# Patient Record
Sex: Female | Born: 1935 | Race: Black or African American | Hispanic: No | Smoking: Never smoker
Health system: Southern US, Community
[De-identification: ages and names within clinical notes are randomized; demographics above are authoritative.]

## PROBLEM LIST (undated history)

## (undated) DIAGNOSIS — K219 Gastro-esophageal reflux disease without esophagitis: Secondary | ICD-10-CM

## (undated) DIAGNOSIS — J309 Allergic rhinitis, unspecified: Secondary | ICD-10-CM

## (undated) DIAGNOSIS — K5909 Other constipation: Secondary | ICD-10-CM

## (undated) DIAGNOSIS — R1013 Epigastric pain: Secondary | ICD-10-CM

## (undated) DIAGNOSIS — I10 Essential (primary) hypertension: Secondary | ICD-10-CM

## (undated) DIAGNOSIS — K3189 Other diseases of stomach and duodenum: Secondary | ICD-10-CM

## (undated) DIAGNOSIS — E1139 Type 2 diabetes mellitus with other diabetic ophthalmic complication: Secondary | ICD-10-CM

## (undated) DIAGNOSIS — E119 Type 2 diabetes mellitus without complications: Secondary | ICD-10-CM

## (undated) DIAGNOSIS — K861 Other chronic pancreatitis: Secondary | ICD-10-CM

## (undated) DIAGNOSIS — K649 Unspecified hemorrhoids: Secondary | ICD-10-CM

## (undated) HISTORY — DX: Unspecified hemorrhoids: K64.9

## (undated) HISTORY — PX: ABDOMINAL HYSTERECTOMY: SHX81

## (undated) HISTORY — DX: Epigastric pain: R10.13

## (undated) HISTORY — DX: Gastro-esophageal reflux disease without esophagitis: K21.9

## (undated) HISTORY — DX: Other chronic pancreatitis: K86.1

## (undated) HISTORY — DX: Other constipation: K59.09

## (undated) HISTORY — DX: Type 2 diabetes mellitus without complications: E11.9

## (undated) HISTORY — DX: Other diseases of stomach and duodenum: K31.89

## (undated) HISTORY — DX: Allergic rhinitis, unspecified: J30.9

## (undated) HISTORY — DX: Essential (primary) hypertension: I10

## (undated) HISTORY — DX: Type 2 diabetes mellitus with other diabetic ophthalmic complication: E11.39

---

## 1997-10-25 ENCOUNTER — Encounter: Payer: Self-pay | Admitting: Gastroenterology

## 1998-04-25 ENCOUNTER — Other Ambulatory Visit: Admission: RE | Admit: 1998-04-25 | Discharge: 1998-04-25 | Payer: Self-pay | Admitting: *Deleted

## 1999-10-23 ENCOUNTER — Encounter: Admission: RE | Admit: 1999-10-23 | Discharge: 1999-10-23 | Payer: Self-pay | Admitting: *Deleted

## 1999-10-23 ENCOUNTER — Encounter: Payer: Self-pay | Admitting: *Deleted

## 2000-06-13 ENCOUNTER — Other Ambulatory Visit: Admission: RE | Admit: 2000-06-13 | Discharge: 2000-06-13 | Payer: Self-pay | Admitting: *Deleted

## 2001-02-17 ENCOUNTER — Encounter: Admission: RE | Admit: 2001-02-17 | Discharge: 2001-02-17 | Payer: Self-pay | Admitting: *Deleted

## 2001-02-17 ENCOUNTER — Encounter: Payer: Self-pay | Admitting: *Deleted

## 2001-02-19 ENCOUNTER — Encounter: Admission: RE | Admit: 2001-02-19 | Discharge: 2001-02-19 | Payer: Self-pay | Admitting: *Deleted

## 2001-02-19 ENCOUNTER — Encounter: Payer: Self-pay | Admitting: *Deleted

## 2002-03-02 ENCOUNTER — Encounter: Admission: RE | Admit: 2002-03-02 | Discharge: 2002-03-02 | Payer: Self-pay | Admitting: *Deleted

## 2002-03-02 ENCOUNTER — Encounter: Payer: Self-pay | Admitting: *Deleted

## 2002-03-13 ENCOUNTER — Encounter: Payer: Self-pay | Admitting: Emergency Medicine

## 2002-03-13 ENCOUNTER — Emergency Department (HOSPITAL_COMMUNITY): Admission: EM | Admit: 2002-03-13 | Discharge: 2002-03-14 | Payer: Self-pay | Admitting: Emergency Medicine

## 2002-05-11 ENCOUNTER — Other Ambulatory Visit: Admission: RE | Admit: 2002-05-11 | Discharge: 2002-05-11 | Payer: Self-pay | Admitting: *Deleted

## 2003-03-24 ENCOUNTER — Encounter: Payer: Self-pay | Admitting: *Deleted

## 2003-03-24 ENCOUNTER — Encounter: Admission: RE | Admit: 2003-03-24 | Discharge: 2003-03-24 | Payer: Self-pay | Admitting: *Deleted

## 2003-09-09 ENCOUNTER — Encounter: Admission: RE | Admit: 2003-09-09 | Discharge: 2003-09-09 | Payer: Self-pay | Admitting: *Deleted

## 2004-07-17 ENCOUNTER — Other Ambulatory Visit: Admission: RE | Admit: 2004-07-17 | Discharge: 2004-07-17 | Payer: Self-pay | Admitting: *Deleted

## 2004-10-30 ENCOUNTER — Encounter: Admission: RE | Admit: 2004-10-30 | Discharge: 2004-10-30 | Payer: Self-pay | Admitting: Endocrinology

## 2006-01-14 ENCOUNTER — Encounter: Admission: RE | Admit: 2006-01-14 | Discharge: 2006-01-14 | Payer: Self-pay | Admitting: Endocrinology

## 2007-01-24 ENCOUNTER — Encounter: Admission: RE | Admit: 2007-01-24 | Discharge: 2007-01-24 | Payer: Self-pay | Admitting: *Deleted

## 2008-02-16 ENCOUNTER — Encounter: Admission: RE | Admit: 2008-02-16 | Discharge: 2008-02-16 | Payer: Self-pay | Admitting: Endocrinology

## 2008-04-05 ENCOUNTER — Other Ambulatory Visit: Admission: RE | Admit: 2008-04-05 | Discharge: 2008-04-05 | Payer: Self-pay | Admitting: Gynecology

## 2008-04-27 ENCOUNTER — Encounter: Payer: Self-pay | Admitting: Gastroenterology

## 2008-04-28 DIAGNOSIS — K3189 Other diseases of stomach and duodenum: Secondary | ICD-10-CM | POA: Insufficient documentation

## 2008-04-28 DIAGNOSIS — R1013 Epigastric pain: Secondary | ICD-10-CM

## 2008-04-28 DIAGNOSIS — K649 Unspecified hemorrhoids: Secondary | ICD-10-CM | POA: Insufficient documentation

## 2008-04-28 DIAGNOSIS — E119 Type 2 diabetes mellitus without complications: Secondary | ICD-10-CM | POA: Insufficient documentation

## 2008-04-28 DIAGNOSIS — J309 Allergic rhinitis, unspecified: Secondary | ICD-10-CM | POA: Insufficient documentation

## 2008-04-28 DIAGNOSIS — E0840 Diabetes mellitus due to underlying condition with diabetic neuropathy, unspecified: Secondary | ICD-10-CM | POA: Insufficient documentation

## 2008-04-28 DIAGNOSIS — I10 Essential (primary) hypertension: Secondary | ICD-10-CM

## 2008-04-28 DIAGNOSIS — K5909 Other constipation: Secondary | ICD-10-CM | POA: Insufficient documentation

## 2008-04-28 DIAGNOSIS — I152 Hypertension secondary to endocrine disorders: Secondary | ICD-10-CM | POA: Insufficient documentation

## 2008-04-28 DIAGNOSIS — E1139 Type 2 diabetes mellitus with other diabetic ophthalmic complication: Secondary | ICD-10-CM | POA: Insufficient documentation

## 2008-04-29 ENCOUNTER — Ambulatory Visit: Payer: Self-pay | Admitting: Gastroenterology

## 2008-04-29 DIAGNOSIS — K5900 Constipation, unspecified: Secondary | ICD-10-CM | POA: Insufficient documentation

## 2008-04-29 DIAGNOSIS — K861 Other chronic pancreatitis: Secondary | ICD-10-CM | POA: Insufficient documentation

## 2008-04-29 LAB — CONVERTED CEMR LAB
ALT: 16 units/L (ref 0–35)
AST: 18 units/L (ref 0–37)
Albumin: 4 g/dL (ref 3.5–5.2)
BUN: 10 mg/dL (ref 6–23)
Basophils Relative: 1 % (ref 0.0–3.0)
CO2: 32 meq/L (ref 19–32)
Chloride: 104 meq/L (ref 96–112)
Creatinine, Ser: 0.8 mg/dL (ref 0.4–1.2)
Eosinophils Absolute: 0 10*3/uL (ref 0.0–0.7)
Eosinophils Relative: 1 % (ref 0.0–5.0)
Ferritin: 34.8 ng/mL (ref 10.0–291.0)
Folate: 15.3 ng/mL
GFR calc non Af Amer: 75 mL/min
Glucose, Bld: 158 mg/dL — ABNORMAL HIGH (ref 70–99)
Iron: 67 ug/dL (ref 42–145)
MCV: 83.7 fL (ref 78.0–100.0)
Monocytes Relative: 6.6 % (ref 3.0–12.0)
Neutrophils Relative %: 51.9 % (ref 43.0–77.0)
RBC: 4.76 M/uL (ref 3.87–5.11)
TSH: 2.92 microintl units/mL (ref 0.35–5.50)
Total Protein: 7.3 g/dL (ref 6.0–8.3)
Vitamin B-12: 479 pg/mL (ref 211–911)
WBC: 4.3 10*3/uL — ABNORMAL LOW (ref 4.5–10.5)

## 2008-05-19 ENCOUNTER — Encounter: Payer: Self-pay | Admitting: Gastroenterology

## 2008-05-19 ENCOUNTER — Ambulatory Visit: Payer: Self-pay | Admitting: Gastroenterology

## 2008-05-24 ENCOUNTER — Encounter: Payer: Self-pay | Admitting: Gastroenterology

## 2008-06-15 ENCOUNTER — Ambulatory Visit: Payer: Self-pay | Admitting: Gastroenterology

## 2008-11-29 ENCOUNTER — Telehealth: Payer: Self-pay | Admitting: Gastroenterology

## 2009-11-09 ENCOUNTER — Telehealth: Payer: Self-pay | Admitting: Gastroenterology

## 2010-04-28 ENCOUNTER — Encounter: Admission: RE | Admit: 2010-04-28 | Discharge: 2010-04-28 | Payer: Self-pay | Admitting: Endocrinology

## 2010-07-16 ENCOUNTER — Encounter: Payer: Self-pay | Admitting: Endocrinology

## 2010-07-25 NOTE — Progress Notes (Signed)
Summary: Triage  Phone Note Call from Patient Call back at Home Phone [phone]   Caller: Patient Call For: Dr. Jarold Motto Reason for Call: Talk to Nurse Summary of Call: What can she take other than Prevacid for GERD...prevacid is not working Initial call taken by: Karna Christmas,  Nov 09, 2009 9:18 AM  Follow-up for Phone Call        Pt has been taking OTC Prevacid 15 mg daily.  Having spells of reflux.  Asks if she needs a stronger medication.   Follow-up by: Ashok Cordia RN,  Nov 09, 2009 9:38 AM  Additional Follow-up for Phone Call Additional follow up Details #1::        Appt scheduled.  Pt wants to wait until after first of June for appt.  Additional Follow-up by: Ashok Cordia RN,  Nov 09, 2009 11:30 AM

## 2010-09-15 ENCOUNTER — Encounter: Payer: Self-pay | Admitting: Gastroenterology

## 2010-09-15 ENCOUNTER — Ambulatory Visit (INDEPENDENT_AMBULATORY_CARE_PROVIDER_SITE_OTHER): Payer: MEDICARE | Admitting: Gastroenterology

## 2010-09-15 VITALS — BP 126/80 | HR 84 | Ht 59.0 in | Wt 161.0 lb

## 2010-09-15 DIAGNOSIS — K5900 Constipation, unspecified: Secondary | ICD-10-CM

## 2010-09-15 DIAGNOSIS — K219 Gastro-esophageal reflux disease without esophagitis: Secondary | ICD-10-CM

## 2010-09-15 DIAGNOSIS — E119 Type 2 diabetes mellitus without complications: Secondary | ICD-10-CM

## 2010-09-15 MED ORDER — POLYETHYLENE GLYCOL 3350 17 GM/SCOOP PO POWD: 17.0000 g | Freq: Every day | ORAL | Status: DC

## 2010-09-15 MED ORDER — DEXLANSOPRAZOLE 60 MG PO CPDR: 60.0000 mg | DELAYED_RELEASE_CAPSULE | Freq: Every day | ORAL | Status: DC

## 2010-09-15 MED ORDER — CELECOXIB 200 MG PO CAPS: 200.0000 mg | ORAL_CAPSULE | Freq: Every day | ORAL | Status: DC

## 2010-09-15 NOTE — Progress Notes (Signed)
History of Present Illness: This is a  Elderly African American female with type 2 diabetes followed by Dr. Vivien Rossetti  Endocrinology on insulin therapy. He does have complications from her diabetes and suspected the motility disorder with chronic constipation and probable mild gastroparesis. Past she's been treated for H. Pylori infection , but I'm not sure this ever helped her dyspepsia symptomatology. He currently has worsening acid reflux and regurgitation with burning substernal chest pain postprandial regurgitation of food products. She denies dysphagia, anorexia or weight loss. Her chronic constipation is non-responsive to Metamucil. She denies rectal bleeding melena. She does have severe pain in her right hip which limits her mobility. He denies use of other NSAIDs, alcohol or cigarettes. She does have a family history of colon cancer in several of her aunts and is up to date on her colonoscopy and endoscopies which were done in 2009.    Current Medications, Allergies, Past Medical History, Past Surgical History, Family History and Social History were reviewed in [employer] record.  Past Medical History  Diagnosis Date  . Unspecified hemorrhoids without mention of complication   . Other constipation   . Dyspepsia and other specified disorders of function of stomach   . Allergic rhinitis, cause unspecified   . Unspecified essential hypertension   . Type II or unspecified type diabetes mellitus without mention of complication, not stated as uncontrolled   . Type II or unspecified type diabetes mellitus with ophthalmic manifestations, not stated as uncontrolled   . GERD (gastroesophageal reflux disease)    Past Surgical History  Procedure Date  . Abdominal hysterectomy   . Cesarean section     X two    reports that she has never smoked. She does not have any smokeless tobacco history on file. She reports that she does not drink alcohol or use illicit drugs. family  history includes Colon cancer in her sister and Heart disease in her father. No Known Allergies    Physical Exam: General: Well developed , well nourished, no acute distress Head: Normocephalic and atraumatic Eyes:  sclerae anicteric, EOMI Ears: Normal auditory acuity Mouth: No deformity or lesions Lungs: Clear throughout to auscultation Heart: Regular rate and rhythm; no murmurs, rubs or bruits Abdomen: Soft, non tender and non distended. No masses, hepatosplenomegaly or hernias noted. Normal Bowel sounds  Musculoskeletal: Symmetrical with no gross deformities  Pulses:  Normal pulses noted Extremities: No clubbing, cyanosis, edema or deformities noted Neurological: Alert oriented x 4, grossly nonfocal Psychological:  Alert and cooperative. 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 She has previously been treated for H. Pylori infection. I have reviewed antireflux regime with her and have given her samples of   Derxilant 60 mg a day  to take 30 minutes before her first meal of the day.. For her hip arthritis,I prescribed Celebrex 200 mg a day as tolerated. Also we will stop Metamucil and place her on her Miralax 8 ounces at bedtime as tolerated for her constipation. We will requestslab data for review from her endocrinologist. I will see her back in 3 weeks' time for followup.

## 2010-09-15 NOTE — Patient Instructions (Addendum)
Stop Prevacid and Metamucil. Start Miralax  Every night.  Try the samples of Dexilant, until your next office visit.  You signed the Records Release for our office to get your records from Dr. Leslie Dales.

## 2010-09-15 NOTE — Assessment & Plan Note (Signed)
Doing well on insulin per endocrinology.

## 2010-09-19 ENCOUNTER — Telehealth: Payer: Self-pay | Admitting: Gastroenterology

## 2010-09-19 NOTE — Telephone Encounter (Signed)
Her insurance will not cover Celebrex, is there an alt drug?

## 2010-09-20 NOTE — Telephone Encounter (Signed)
no

## 2010-09-20 NOTE — Telephone Encounter (Signed)
Advised pharm by fax and phone call no alt for Celebrex. They will advised pt she can purchase.

## 2010-10-05 ENCOUNTER — Telehealth: Payer: Self-pay | Admitting: Gastroenterology

## 2010-10-05 NOTE — Telephone Encounter (Signed)
Forwarded to Dr. Patterson for review.  °

## 2010-10-10 ENCOUNTER — Encounter: Payer: Self-pay | Admitting: Gastroenterology

## 2010-10-10 ENCOUNTER — Ambulatory Visit (INDEPENDENT_AMBULATORY_CARE_PROVIDER_SITE_OTHER): Payer: MEDICARE | Admitting: Gastroenterology

## 2010-10-10 DIAGNOSIS — K219 Gastro-esophageal reflux disease without esophagitis: Secondary | ICD-10-CM | POA: Insufficient documentation

## 2010-10-10 DIAGNOSIS — K589 Irritable bowel syndrome without diarrhea: Secondary | ICD-10-CM

## 2010-10-10 MED ORDER — DEXLANSOPRAZOLE 60 MG PO CPDR: 60.0000 mg | DELAYED_RELEASE_CAPSULE | Freq: Every day | ORAL | Status: DC

## 2010-10-10 NOTE — Patient Instructions (Signed)
Take the Dexilant once a day, we have gave you samples when the samples run out you will need to call back and we will send an rx.

## 2010-10-10 NOTE — Progress Notes (Signed)
History of Present Illness: This is a elderly 77 year old African American female who has chronic GERD well-managed currently on Dexilant 60 mg a day. She also has suspected chronic pancreatitis is not on pancreatic extracts. Her diabetes is well controlled on insulin therapy. Her chronic constipation seems to be managed with fiber supplements and liberal by mouth fluids. Currently there is no abdominal pain, melena or hematochezia, anorexia or weight loss.    Current Medications, Allergies, Past Medical History, Past Surgical History, Family History and Social History were reviewed in [employer] record.   Assessment and plan: Continue current medications as tolerated with when necessary followup as needed. No diagnosis found.

## 2011-03-28 LAB — GLUCOSE, CAPILLARY: Glucose-Capillary: 168 — ABNORMAL HIGH

## 2011-05-22 ENCOUNTER — Emergency Department (HOSPITAL_COMMUNITY)
Admission: EM | Admit: 2011-05-22 | Discharge: 2011-05-22 | Disposition: A | Discharge status: Home / Self Care | Payer: MEDICARE | Attending: Emergency Medicine | Admitting: Emergency Medicine

## 2011-05-22 ENCOUNTER — Encounter (HOSPITAL_COMMUNITY): Payer: Self-pay

## 2011-05-22 ENCOUNTER — Other Ambulatory Visit: Payer: Self-pay

## 2011-05-22 DIAGNOSIS — I10 Essential (primary) hypertension: Secondary | ICD-10-CM | POA: Insufficient documentation

## 2011-05-22 DIAGNOSIS — Z794 Long term (current) use of insulin: Secondary | ICD-10-CM | POA: Insufficient documentation

## 2011-05-22 DIAGNOSIS — K219 Gastro-esophageal reflux disease without esophagitis: Secondary | ICD-10-CM | POA: Insufficient documentation

## 2011-05-22 DIAGNOSIS — R002 Palpitations: Secondary | ICD-10-CM | POA: Insufficient documentation

## 2011-05-22 DIAGNOSIS — Z79899 Other long term (current) drug therapy: Secondary | ICD-10-CM | POA: Insufficient documentation

## 2011-05-22 DIAGNOSIS — R000 Tachycardia, unspecified: Secondary | ICD-10-CM | POA: Insufficient documentation

## 2011-05-22 DIAGNOSIS — E119 Type 2 diabetes mellitus without complications: Secondary | ICD-10-CM | POA: Insufficient documentation

## 2011-05-22 LAB — COMPREHENSIVE METABOLIC PANEL
AST: 22 U/L (ref 0–37)
Alkaline Phosphatase: 70 U/L (ref 39–117)
CO2: 28 mEq/L (ref 19–32)
Chloride: 101 mEq/L (ref 96–112)
Creatinine, Ser: 0.65 mg/dL (ref 0.50–1.10)
GFR calc non Af Amer: 84 mL/min — ABNORMAL LOW (ref >90)
Potassium: 3.3 mEq/L — ABNORMAL LOW (ref 3.5–5.1)
Total Bilirubin: 0.3 mg/dL (ref 0.3–1.2)

## 2011-05-22 LAB — DIFFERENTIAL
Basophils Absolute: 0 10*3/uL (ref 0.0–0.1)
Lymphocytes Relative: 16 % (ref 12–46)
Monocytes Absolute: 0.5 10*3/uL (ref 0.1–1.0)
Monocytes Relative: 6 % (ref 3–12)
Neutro Abs: 7 10*3/uL (ref 1.7–7.7)
Neutrophils Relative %: 78 % — ABNORMAL HIGH (ref 43–77)

## 2011-05-22 LAB — CBC
HCT: 42.2 % (ref 36.0–46.0)
Hemoglobin: 13.4 g/dL (ref 12.0–15.0)
RDW: 14.2 % (ref 11.5–15.5)
WBC: 8.9 10*3/uL (ref 4.0–10.5)

## 2011-05-22 LAB — CK TOTAL AND CKMB (NOT AT ARMC): CK, MB: 3.8 ng/mL (ref 0.3–4.0)

## 2011-05-22 MED ORDER — GLUCOSE 40 % PO GEL
ORAL | Status: AC
  Filled 2011-05-22: qty 1

## 2011-05-22 NOTE — ED Notes (Signed)
Patient stated to EMS she felt like her heart was "racing away"  Ate supper about 5pm and then shortley thereafter felt like her heart was racing.  Has had some stress recently.

## 2011-05-22 NOTE — ED Notes (Signed)
Pt given glucose due to low blood sugar - apparently checked sugar at home - took insulin, then ate, then blood glucose fell.  Feels fine now.

## 2011-05-22 NOTE — ED Notes (Signed)
Pt ambulatory to bathroom without difficulty - requested specimen.

## 2011-05-22 NOTE — ED Provider Notes (Signed)
History     CSN: [id number] Arrival date & time: 05/22/2011  7:28 PM   First MD Initiated Contact with Patient 05/22/11 1953      Chief Complaint  Patient presents with  . Palpitations    (Consider location/radiation/quality/duration/timing/severity/associated sxs/prior treatment) HPI Comments: Was at home, had just eaten, then started with her heart racing, palpitations.  She denied chest pain.  No sob or cough.  Resolved prior to ambulance arrival.  Feels fine now.    Patient is a 77 y.o. female presenting with palpitations. The history is provided by the patient.  Palpitations  This is a new problem. The current episode started less than 1 hour ago. The problem occurs constantly. The problem has been resolved. The problem is associated with stress. Pertinent negatives include no diaphoresis, no fever, no chest pain, no abdominal pain, no nausea and no shortness of breath.    Past Medical History  Diagnosis Date  . Unspecified hemorrhoids without mention of complication   . Other constipation   . Dyspepsia and other specified disorders of function of stomach   . Allergic rhinitis, cause unspecified   . Unspecified essential hypertension   . Type II or unspecified type diabetes mellitus without mention of complication, not stated as uncontrolled   . Type II or unspecified type diabetes mellitus with ophthalmic manifestations, not stated as uncontrolled   . GERD (gastroesophageal reflux disease)   . Chronic pancreatitis   . Family history of malignant neoplasm of gastrointestinal tract     Past Surgical History  Procedure Date  . Abdominal hysterectomy   . Cesarean section     X two    Family History  Problem Relation Age of Onset  . Heart disease Father   . Colon cancer Sister     and Several Aunts    History  Substance Use Topics  . Smoking status: Never Smoker   . Smokeless tobacco: Not on file  . Alcohol Use: No    OB History    Grav Para Term Preterm  Abortions TAB SAB Ect Mult Living                  Review of Systems  Constitutional: Negative for fever and diaphoresis.  Respiratory: Negative for shortness of breath.   Cardiovascular: Positive for palpitations. Negative for chest pain.  Gastrointestinal: Negative for nausea and abdominal pain.    Allergies  Review of patient's allergies indicates no known allergies.  Home Medications   Current Outpatient Rx  Name Route Sig Dispense Refill  . AMLODIPINE BESYLATE 10 MG PO TABS Oral Take 10 mg by mouth daily.      [name] 66.10-12-73 MU PO CPEP Oral Take 1 capsule by mouth daily.      Olivia Roberts CALCIUM CARBONATE ANTACID 500 MG PO CHEW Oral Chew 2 tablets by mouth daily. At dinnertime     . DEXLANSOPRAZOLE 60 MG PO CPDR Oral Take 1 capsule (60 mg total) by mouth daily. 60 capsule 0    Samples of this drug were given to the patient, qu ...  . ERGOCALCIFEROL 50000 UNITS PO CAPS Oral Take 50,000 Units by mouth once a week. Take one by mouth once a week Every Sunday    . FLUVASTATIN SODIUM ER 80 MG PO TB24 Oral Take 80 mg by mouth daily.      . INSULIN LISPRO PROT & LISPRO (75-25) 100 UNIT/ML [place] SUSP Subcutaneous Inject 10-15 Units into the skin 2 (two) times daily with a  meal. 10 units in the morning 15 units in the evening    . LOSARTAN POTASSIUM-HCTZ 100-12.5 MG PO TABS Oral Take 1 tablet by mouth daily.     Olivia Roberts OLMESARTAN MEDOXOMIL-HCTZ 40-12.5 MG PO TABS Oral Take 1 tablet by mouth daily.      Olivia Roberts PIOGLITAZONE HCL 30 MG PO TABS Oral Take 30 mg by mouth daily.      Olivia Roberts POTASSIUM CHLORIDE CRYS CR 20 MEQ PO TBCR Oral Take 20 mEq by mouth daily.      Olivia Roberts POTASSIUM CHLORIDE CRYS CR 20 MEQ PO TBCR Oral Take 10 mEq by mouth once.      Olivia Roberts PRAVASTATIN SODIUM 80 MG PO TABS Oral Take 80 mg by mouth daily.     Olivia Roberts METAMUCIL PO Oral Take 10 mLs by mouth daily. Two tablespoons twice a day      BP 135/63  Pulse 88  Temp(Src) 97.6 F (36.4 C) (Oral)  Resp 18  SpO2 97%  Physical Exam  ED  Course  Procedures (including critical care time)   Labs Reviewed  CBC  DIFFERENTIAL  COMPREHENSIVE METABOLIC PANEL  CK TOTAL AND CKMB  TROPONIN I  TSH   No results found.   No diagnosis found.   Date: 05/22/2011  Rate: 84  Rhythm: normal sinus rhythm  QRS Axis: normal  Intervals: normal  ST/T Wave abnormalities: normal  Conduction Disutrbances:none  Narrative Interpretation:   Old EKG Reviewed: unchanged    MDM  Patient arrived symptom-free and remained that way throughout the visit.  Her EKG showed a sinus rhythm without any acute findings.  Her labs were okay with the exception of a slightly low K of 3.3.  I will discharge the patient and have her follow up with her pcp and return if she worsens.  She has been under a lot of stress and tonight's episode could have been related to anxiety.        Geoffery Lyons, MD 05/22/11 2221

## 2011-05-22 NOTE — ED Notes (Signed)
Pt states she had just finished eating when she became sweaty and had some heart palpitations - no history of cardiac problems - feels fine now - denies chest pain or any pain or discomfort at this time.

## 2011-05-23 LAB — TSH: TSH: 4.08 u[IU]/mL (ref 0.350–4.500)

## 2011-07-26 ENCOUNTER — Other Ambulatory Visit: Payer: Self-pay | Admitting: Gastroenterology

## 2011-08-25 ENCOUNTER — Other Ambulatory Visit: Payer: Self-pay | Admitting: Gastroenterology

## 2011-09-12 DIAGNOSIS — E785 Hyperlipidemia, unspecified: Secondary | ICD-10-CM | POA: Diagnosis not present

## 2011-09-12 DIAGNOSIS — R82998 Other abnormal findings in urine: Secondary | ICD-10-CM | POA: Diagnosis not present

## 2011-09-12 DIAGNOSIS — E559 Vitamin D deficiency, unspecified: Secondary | ICD-10-CM | POA: Diagnosis not present

## 2011-09-12 DIAGNOSIS — E119 Type 2 diabetes mellitus without complications: Secondary | ICD-10-CM | POA: Diagnosis not present

## 2011-09-14 DIAGNOSIS — F341 Dysthymic disorder: Secondary | ICD-10-CM | POA: Diagnosis not present

## 2011-09-14 DIAGNOSIS — I10 Essential (primary) hypertension: Secondary | ICD-10-CM | POA: Diagnosis not present

## 2011-09-14 DIAGNOSIS — E559 Vitamin D deficiency, unspecified: Secondary | ICD-10-CM | POA: Diagnosis not present

## 2011-09-14 DIAGNOSIS — E119 Type 2 diabetes mellitus without complications: Secondary | ICD-10-CM | POA: Diagnosis not present

## 2011-09-14 DIAGNOSIS — E785 Hyperlipidemia, unspecified: Secondary | ICD-10-CM | POA: Diagnosis not present

## 2011-12-19 DIAGNOSIS — E559 Vitamin D deficiency, unspecified: Secondary | ICD-10-CM | POA: Diagnosis not present

## 2011-12-19 DIAGNOSIS — E119 Type 2 diabetes mellitus without complications: Secondary | ICD-10-CM | POA: Diagnosis not present

## 2011-12-19 DIAGNOSIS — E785 Hyperlipidemia, unspecified: Secondary | ICD-10-CM | POA: Diagnosis not present

## 2011-12-21 DIAGNOSIS — I10 Essential (primary) hypertension: Secondary | ICD-10-CM | POA: Diagnosis not present

## 2011-12-21 DIAGNOSIS — F341 Dysthymic disorder: Secondary | ICD-10-CM | POA: Diagnosis not present

## 2011-12-21 DIAGNOSIS — E11329 Type 2 diabetes mellitus with mild nonproliferative diabetic retinopathy without macular edema: Secondary | ICD-10-CM | POA: Diagnosis not present

## 2011-12-21 DIAGNOSIS — E785 Hyperlipidemia, unspecified: Secondary | ICD-10-CM | POA: Diagnosis not present

## 2011-12-21 DIAGNOSIS — E559 Vitamin D deficiency, unspecified: Secondary | ICD-10-CM | POA: Diagnosis not present

## 2011-12-21 DIAGNOSIS — E1165 Type 2 diabetes mellitus with hyperglycemia: Secondary | ICD-10-CM | POA: Diagnosis not present

## 2012-02-29 ENCOUNTER — Emergency Department (HOSPITAL_COMMUNITY)
Admission: EM | Admit: 2012-02-29 | Discharge: 2012-02-29 | Disposition: A | Discharge status: Home / Self Care | Payer: MEDICARE | Attending: Emergency Medicine | Admitting: Emergency Medicine

## 2012-02-29 ENCOUNTER — Emergency Department (HOSPITAL_COMMUNITY): Payer: MEDICARE

## 2012-02-29 ENCOUNTER — Encounter (HOSPITAL_COMMUNITY): Payer: Self-pay

## 2012-02-29 DIAGNOSIS — E119 Type 2 diabetes mellitus without complications: Secondary | ICD-10-CM | POA: Insufficient documentation

## 2012-02-29 DIAGNOSIS — K219 Gastro-esophageal reflux disease without esophagitis: Secondary | ICD-10-CM | POA: Insufficient documentation

## 2012-02-29 DIAGNOSIS — Z23 Encounter for immunization: Secondary | ICD-10-CM | POA: Diagnosis not present

## 2012-02-29 DIAGNOSIS — S61209A Unspecified open wound of unspecified finger without damage to nail, initial encounter: Secondary | ICD-10-CM | POA: Insufficient documentation

## 2012-02-29 DIAGNOSIS — I10 Essential (primary) hypertension: Secondary | ICD-10-CM | POA: Insufficient documentation

## 2012-02-29 DIAGNOSIS — S61409A Unspecified open wound of unspecified hand, initial encounter: Secondary | ICD-10-CM | POA: Diagnosis not present

## 2012-02-29 DIAGNOSIS — W278XXA Contact with other nonpowered hand tool, initial encounter: Secondary | ICD-10-CM | POA: Insufficient documentation

## 2012-02-29 DIAGNOSIS — S61339A Puncture wound without foreign body of unspecified finger with damage to nail, initial encounter: Secondary | ICD-10-CM

## 2012-02-29 MED ORDER — TETANUS-DIPHTH-ACELL PERTUSSIS 5-2.5-18.5 LF-MCG/0.5 IM SUSP
0.5000 mL | Freq: Once | INTRAMUSCULAR | Status: AC
  Administered 2012-02-29: 0.5 mL via INTRAMUSCULAR
  Filled 2012-02-29: qty 0.5

## 2012-02-29 MED ORDER — CEPHALEXIN 500 MG PO CAPS: 500.0000 mg | ORAL_CAPSULE | Freq: Four times a day (QID) | ORAL | Status: AC

## 2012-02-29 NOTE — ED Provider Notes (Signed)
History     CSN: [id number]  Arrival date & time 02/29/12  1600   First MD Initiated Contact with Patient 02/29/12 1734      Chief Complaint  Patient presents with  . needs tetanus shot    . Puncture Wound    (Consider location/radiation/quality/duration/timing/severity/associated sxs/prior treatment) Patient is a 78 y.o. female presenting with hand pain.  Hand Pain This is a new problem. The current episode started today. The problem has been unchanged.  Patient states she suffered a puncture wound to her left index finger from the needle on her sewing machine today.  Patient states she noted the needle had broken off.    Past Medical History  Diagnosis Date  . Unspecified hemorrhoids without mention of complication   . Other constipation   . Dyspepsia and other specified disorders of function of stomach   . Allergic rhinitis, cause unspecified   . Unspecified essential hypertension   . Type II or unspecified type diabetes mellitus without mention of complication, not stated as uncontrolled   . Type II or unspecified type diabetes mellitus with ophthalmic manifestations, not stated as uncontrolled   . GERD (gastroesophageal reflux disease)   . Chronic pancreatitis   . Family history of malignant neoplasm of gastrointestinal tract     Past Surgical History  Procedure Date  . Abdominal hysterectomy   . Cesarean section     X two    Family History  Problem Relation Age of Onset  . Heart disease Father   . Colon cancer Sister     and Several Aunts    History  Substance Use Topics  . Smoking status: Never Smoker   . Smokeless tobacco: Not on file  . Alcohol Use: No    OB History    Grav Para Term Preterm Abortions TAB SAB Ect Mult Living                  Review of Systems  All other systems reviewed and are negative.    Allergies  Review of patient's allergies indicates no known allergies.  Home Medications   Current Outpatient Rx  Name Route Sig  Dispense Refill  . AMLODIPINE BESYLATE 10 MG PO TABS Oral Take 10 mg by mouth daily.     [name] 66.10-12-73 MU PO CPEP Oral Take 1 capsule by mouth daily.     Marland Kitchen CALCIUM CARBONATE ANTACID 500 MG PO CHEW Oral Chew 2 tablets by mouth daily. At dinnertime    . ERGOCALCIFEROL 50000 UNITS PO CAPS Oral Take 50,000 Units by mouth once a week. Take one by mouth once a week Every Sunday    . FLUVASTATIN SODIUM ER 80 MG PO TB24 Oral Take 80 mg by mouth daily.     . INSULIN LISPRO PROT & LISPRO (75-25) 100 UNIT/ML [place] SUSP Subcutaneous Inject 10-15 Units into the skin 2 (two) times daily with a meal. 10 units in the morning 15 units in the evening    . LOSARTAN POTASSIUM-HCTZ 100-12.5 MG PO TABS Oral Take 1 tablet by mouth daily.     Marland Kitchen PIOGLITAZONE HCL 30 MG PO TABS Oral Take 30 mg by mouth daily.     Marland Kitchen POTASSIUM CHLORIDE CRYS ER 20 MEQ PO TBCR Oral Take 20 mEq by mouth daily.     Marland Kitchen METAMUCIL PO Oral Take 10 mLs by mouth daily. Two tablespoons twice a day      BP 148/52  Pulse 72  Temp 98.5 F (36.9 C) (  Oral)  Resp 16  SpO2 100%  Physical Exam  Nursing note and vitals reviewed. Constitutional: She is oriented to person, place, and time. She appears well-developed and well-nourished.  HENT:  Head: Normocephalic.  Eyes: Conjunctivae are normal. Pupils are equal, round, and reactive to light.  Neck: Normal range of motion. Neck supple.  Cardiovascular: Normal rate and regular rhythm.   Pulmonary/Chest: Effort normal and breath sounds normal.  Abdominal: Soft. Bowel sounds are normal.  Musculoskeletal: Normal range of motion.       Hands: Neurological: She is alert and oriented to person, place, and time.  Skin: Skin is warm and dry.  Psychiatric: She has a normal mood and affect. Her behavior is normal. Judgment and thought content normal.    ED Course  Procedures (including critical care time)  Labs Reviewed - No data to display Dg Finger Index Right  02/29/2012   *RADIOLOGY REPORT*  Clinical Data: Puncture wound to index finger.  Pain and swelling.  RIGHT INDEX FINGER 2+V  Comparison: None.  Findings: Mild soft tissue swelling is seen involving the distal index finger.  No evidence of radiopaque foreign body.  No evidence of fracture or dislocation.  IMPRESSION: Mild soft tissue swelling.  No evidence of fracture or radiopaque foreign body.   Original Report Authenticated By: Danae Orleans, M.D.      No diagnosis found. Puncture wound of left index finger.  Discussed with Dr. Rhunette Croft.  Will cover with 5 day course of keflex and update tetanus with tdap. MDM          Jimmye Norman, NP 03/01/12 [phone]

## 2012-02-29 NOTE — ED Notes (Signed)
Pt was sewing. States needle went all the way through the LT index finger, from the nail side through the fingertip.  States when she pulled it out, the tip was no longer on the needle.  She says it doesn't feel the tip of the needle is in her finger, but she'd like to make sure.  Also, last tetanus shot was >75yrs so her PMD sent her here to get one.

## 2012-03-01 NOTE — ED Provider Notes (Signed)
Medical screening examination/treatment/procedure(s) were conducted as a shared visit with non-physician practitioner(s) and myself.  I personally evaluated the patient during the encounter  Derwood Kaplan, MD 03/01/12 0127

## 2012-03-24 DIAGNOSIS — E559 Vitamin D deficiency, unspecified: Secondary | ICD-10-CM | POA: Diagnosis not present

## 2012-03-24 DIAGNOSIS — E785 Hyperlipidemia, unspecified: Secondary | ICD-10-CM | POA: Diagnosis not present

## 2012-03-24 DIAGNOSIS — E1139 Type 2 diabetes mellitus with other diabetic ophthalmic complication: Secondary | ICD-10-CM | POA: Diagnosis not present

## 2012-03-24 DIAGNOSIS — E1165 Type 2 diabetes mellitus with hyperglycemia: Secondary | ICD-10-CM | POA: Diagnosis not present

## 2012-03-26 DIAGNOSIS — Z23 Encounter for immunization: Secondary | ICD-10-CM | POA: Diagnosis not present

## 2012-03-26 DIAGNOSIS — E11329 Type 2 diabetes mellitus with mild nonproliferative diabetic retinopathy without macular edema: Secondary | ICD-10-CM | POA: Diagnosis not present

## 2012-03-26 DIAGNOSIS — E559 Vitamin D deficiency, unspecified: Secondary | ICD-10-CM | POA: Diagnosis not present

## 2012-03-26 DIAGNOSIS — I10 Essential (primary) hypertension: Secondary | ICD-10-CM | POA: Diagnosis not present

## 2012-03-26 DIAGNOSIS — E1165 Type 2 diabetes mellitus with hyperglycemia: Secondary | ICD-10-CM | POA: Diagnosis not present

## 2012-03-26 DIAGNOSIS — E785 Hyperlipidemia, unspecified: Secondary | ICD-10-CM | POA: Diagnosis not present

## 2012-03-26 DIAGNOSIS — F341 Dysthymic disorder: Secondary | ICD-10-CM | POA: Diagnosis not present

## 2012-04-25 DIAGNOSIS — E1139 Type 2 diabetes mellitus with other diabetic ophthalmic complication: Secondary | ICD-10-CM | POA: Diagnosis not present

## 2012-04-25 DIAGNOSIS — F341 Dysthymic disorder: Secondary | ICD-10-CM | POA: Diagnosis not present

## 2012-04-25 DIAGNOSIS — E785 Hyperlipidemia, unspecified: Secondary | ICD-10-CM | POA: Diagnosis not present

## 2012-04-25 DIAGNOSIS — E559 Vitamin D deficiency, unspecified: Secondary | ICD-10-CM | POA: Diagnosis not present

## 2012-04-25 DIAGNOSIS — E11329 Type 2 diabetes mellitus with mild nonproliferative diabetic retinopathy without macular edema: Secondary | ICD-10-CM | POA: Diagnosis not present

## 2012-04-25 DIAGNOSIS — M25579 Pain in unspecified ankle and joints of unspecified foot: Secondary | ICD-10-CM | POA: Diagnosis not present

## 2012-04-25 DIAGNOSIS — I10 Essential (primary) hypertension: Secondary | ICD-10-CM | POA: Diagnosis not present

## 2012-07-07 DIAGNOSIS — E785 Hyperlipidemia, unspecified: Secondary | ICD-10-CM | POA: Diagnosis not present

## 2012-07-07 DIAGNOSIS — E559 Vitamin D deficiency, unspecified: Secondary | ICD-10-CM | POA: Diagnosis not present

## 2012-07-07 DIAGNOSIS — E1139 Type 2 diabetes mellitus with other diabetic ophthalmic complication: Secondary | ICD-10-CM | POA: Diagnosis not present

## 2012-07-07 DIAGNOSIS — E1165 Type 2 diabetes mellitus with hyperglycemia: Secondary | ICD-10-CM | POA: Diagnosis not present

## 2012-07-09 DIAGNOSIS — E1139 Type 2 diabetes mellitus with other diabetic ophthalmic complication: Secondary | ICD-10-CM | POA: Diagnosis not present

## 2012-07-09 DIAGNOSIS — E1165 Type 2 diabetes mellitus with hyperglycemia: Secondary | ICD-10-CM | POA: Diagnosis not present

## 2012-07-09 DIAGNOSIS — I10 Essential (primary) hypertension: Secondary | ICD-10-CM | POA: Diagnosis not present

## 2012-07-09 DIAGNOSIS — E785 Hyperlipidemia, unspecified: Secondary | ICD-10-CM | POA: Diagnosis not present

## 2012-07-09 DIAGNOSIS — E11329 Type 2 diabetes mellitus with mild nonproliferative diabetic retinopathy without macular edema: Secondary | ICD-10-CM | POA: Diagnosis not present

## 2012-07-09 DIAGNOSIS — F341 Dysthymic disorder: Secondary | ICD-10-CM | POA: Diagnosis not present

## 2012-07-09 DIAGNOSIS — E559 Vitamin D deficiency, unspecified: Secondary | ICD-10-CM | POA: Diagnosis not present

## 2012-10-14 DIAGNOSIS — E1165 Type 2 diabetes mellitus with hyperglycemia: Secondary | ICD-10-CM | POA: Diagnosis not present

## 2012-10-14 DIAGNOSIS — E559 Vitamin D deficiency, unspecified: Secondary | ICD-10-CM | POA: Diagnosis not present

## 2012-10-14 DIAGNOSIS — E785 Hyperlipidemia, unspecified: Secondary | ICD-10-CM | POA: Diagnosis not present

## 2012-10-14 DIAGNOSIS — E1139 Type 2 diabetes mellitus with other diabetic ophthalmic complication: Secondary | ICD-10-CM | POA: Diagnosis not present

## 2012-10-15 DIAGNOSIS — E11329 Type 2 diabetes mellitus with mild nonproliferative diabetic retinopathy without macular edema: Secondary | ICD-10-CM | POA: Diagnosis not present

## 2012-10-15 DIAGNOSIS — F341 Dysthymic disorder: Secondary | ICD-10-CM | POA: Diagnosis not present

## 2012-10-15 DIAGNOSIS — E1139 Type 2 diabetes mellitus with other diabetic ophthalmic complication: Secondary | ICD-10-CM | POA: Diagnosis not present

## 2012-10-15 DIAGNOSIS — E1165 Type 2 diabetes mellitus with hyperglycemia: Secondary | ICD-10-CM | POA: Diagnosis not present

## 2012-10-15 DIAGNOSIS — E785 Hyperlipidemia, unspecified: Secondary | ICD-10-CM | POA: Diagnosis not present

## 2012-10-15 DIAGNOSIS — I10 Essential (primary) hypertension: Secondary | ICD-10-CM | POA: Diagnosis not present

## 2012-10-15 DIAGNOSIS — E559 Vitamin D deficiency, unspecified: Secondary | ICD-10-CM | POA: Diagnosis not present

## 2012-12-22 ENCOUNTER — Encounter (INDEPENDENT_AMBULATORY_CARE_PROVIDER_SITE_OTHER): Payer: Self-pay | Admitting: Ophthalmology

## 2012-12-30 ENCOUNTER — Encounter (INDEPENDENT_AMBULATORY_CARE_PROVIDER_SITE_OTHER): Payer: Self-pay | Admitting: Ophthalmology

## 2012-12-31 ENCOUNTER — Ambulatory Visit (INDEPENDENT_AMBULATORY_CARE_PROVIDER_SITE_OTHER): Payer: MEDICARE | Admitting: Ophthalmology

## 2012-12-31 ENCOUNTER — Encounter (INDEPENDENT_AMBULATORY_CARE_PROVIDER_SITE_OTHER): Payer: Self-pay | Admitting: Ophthalmology

## 2012-12-31 DIAGNOSIS — E11319 Type 2 diabetes mellitus with unspecified diabetic retinopathy without macular edema: Secondary | ICD-10-CM

## 2012-12-31 DIAGNOSIS — E1139 Type 2 diabetes mellitus with other diabetic ophthalmic complication: Secondary | ICD-10-CM | POA: Diagnosis not present

## 2012-12-31 DIAGNOSIS — H35439 Paving stone degeneration of retina, unspecified eye: Secondary | ICD-10-CM

## 2012-12-31 DIAGNOSIS — H35039 Hypertensive retinopathy, unspecified eye: Secondary | ICD-10-CM | POA: Diagnosis not present

## 2012-12-31 DIAGNOSIS — I10 Essential (primary) hypertension: Secondary | ICD-10-CM | POA: Diagnosis not present

## 2012-12-31 DIAGNOSIS — H43819 Vitreous degeneration, unspecified eye: Secondary | ICD-10-CM

## 2012-12-31 DIAGNOSIS — E1165 Type 2 diabetes mellitus with hyperglycemia: Secondary | ICD-10-CM

## 2012-12-31 DIAGNOSIS — H2510 Age-related nuclear cataract, unspecified eye: Secondary | ICD-10-CM

## 2013-03-09 DIAGNOSIS — E559 Vitamin D deficiency, unspecified: Secondary | ICD-10-CM | POA: Diagnosis not present

## 2013-03-09 DIAGNOSIS — E119 Type 2 diabetes mellitus without complications: Secondary | ICD-10-CM | POA: Diagnosis not present

## 2013-03-09 DIAGNOSIS — E785 Hyperlipidemia, unspecified: Secondary | ICD-10-CM | POA: Diagnosis not present

## 2013-03-13 DIAGNOSIS — I10 Essential (primary) hypertension: Secondary | ICD-10-CM | POA: Diagnosis not present

## 2013-03-13 DIAGNOSIS — E1139 Type 2 diabetes mellitus with other diabetic ophthalmic complication: Secondary | ICD-10-CM | POA: Diagnosis not present

## 2013-03-13 DIAGNOSIS — E785 Hyperlipidemia, unspecified: Secondary | ICD-10-CM | POA: Diagnosis not present

## 2013-03-13 DIAGNOSIS — F341 Dysthymic disorder: Secondary | ICD-10-CM | POA: Diagnosis not present

## 2013-03-13 DIAGNOSIS — E11329 Type 2 diabetes mellitus with mild nonproliferative diabetic retinopathy without macular edema: Secondary | ICD-10-CM | POA: Diagnosis not present

## 2013-03-13 DIAGNOSIS — Z23 Encounter for immunization: Secondary | ICD-10-CM | POA: Diagnosis not present

## 2013-03-13 DIAGNOSIS — E559 Vitamin D deficiency, unspecified: Secondary | ICD-10-CM | POA: Diagnosis not present

## 2013-06-08 DIAGNOSIS — F341 Dysthymic disorder: Secondary | ICD-10-CM | POA: Diagnosis not present

## 2013-06-08 DIAGNOSIS — E1139 Type 2 diabetes mellitus with other diabetic ophthalmic complication: Secondary | ICD-10-CM | POA: Diagnosis not present

## 2013-06-08 DIAGNOSIS — E559 Vitamin D deficiency, unspecified: Secondary | ICD-10-CM | POA: Diagnosis not present

## 2013-06-12 DIAGNOSIS — E11329 Type 2 diabetes mellitus with mild nonproliferative diabetic retinopathy without macular edema: Secondary | ICD-10-CM | POA: Diagnosis not present

## 2013-06-12 DIAGNOSIS — E1139 Type 2 diabetes mellitus with other diabetic ophthalmic complication: Secondary | ICD-10-CM | POA: Diagnosis not present

## 2013-06-12 DIAGNOSIS — I10 Essential (primary) hypertension: Secondary | ICD-10-CM | POA: Diagnosis not present

## 2013-06-12 DIAGNOSIS — E559 Vitamin D deficiency, unspecified: Secondary | ICD-10-CM | POA: Diagnosis not present

## 2013-06-12 DIAGNOSIS — E785 Hyperlipidemia, unspecified: Secondary | ICD-10-CM | POA: Diagnosis not present

## 2013-06-12 DIAGNOSIS — F341 Dysthymic disorder: Secondary | ICD-10-CM | POA: Diagnosis not present

## 2013-09-14 DIAGNOSIS — E1139 Type 2 diabetes mellitus with other diabetic ophthalmic complication: Secondary | ICD-10-CM | POA: Diagnosis not present

## 2013-09-17 DIAGNOSIS — I10 Essential (primary) hypertension: Secondary | ICD-10-CM | POA: Diagnosis not present

## 2013-09-17 DIAGNOSIS — E785 Hyperlipidemia, unspecified: Secondary | ICD-10-CM | POA: Diagnosis not present

## 2013-09-17 DIAGNOSIS — E559 Vitamin D deficiency, unspecified: Secondary | ICD-10-CM | POA: Diagnosis not present

## 2013-09-17 DIAGNOSIS — F341 Dysthymic disorder: Secondary | ICD-10-CM | POA: Diagnosis not present

## 2013-09-17 DIAGNOSIS — E1139 Type 2 diabetes mellitus with other diabetic ophthalmic complication: Secondary | ICD-10-CM | POA: Diagnosis not present

## 2013-09-17 DIAGNOSIS — E1165 Type 2 diabetes mellitus with hyperglycemia: Secondary | ICD-10-CM | POA: Diagnosis not present

## 2013-09-17 DIAGNOSIS — E11329 Type 2 diabetes mellitus with mild nonproliferative diabetic retinopathy without macular edema: Secondary | ICD-10-CM | POA: Diagnosis not present

## 2013-12-23 DIAGNOSIS — E1139 Type 2 diabetes mellitus with other diabetic ophthalmic complication: Secondary | ICD-10-CM | POA: Diagnosis not present

## 2013-12-23 DIAGNOSIS — E785 Hyperlipidemia, unspecified: Secondary | ICD-10-CM | POA: Diagnosis not present

## 2013-12-28 DIAGNOSIS — E11329 Type 2 diabetes mellitus with mild nonproliferative diabetic retinopathy without macular edema: Secondary | ICD-10-CM | POA: Diagnosis not present

## 2013-12-28 DIAGNOSIS — E1139 Type 2 diabetes mellitus with other diabetic ophthalmic complication: Secondary | ICD-10-CM | POA: Diagnosis not present

## 2013-12-28 DIAGNOSIS — E785 Hyperlipidemia, unspecified: Secondary | ICD-10-CM | POA: Diagnosis not present

## 2013-12-28 DIAGNOSIS — E559 Vitamin D deficiency, unspecified: Secondary | ICD-10-CM | POA: Diagnosis not present

## 2013-12-28 DIAGNOSIS — F341 Dysthymic disorder: Secondary | ICD-10-CM | POA: Diagnosis not present

## 2013-12-28 DIAGNOSIS — I10 Essential (primary) hypertension: Secondary | ICD-10-CM | POA: Diagnosis not present

## 2013-12-28 DIAGNOSIS — E1165 Type 2 diabetes mellitus with hyperglycemia: Secondary | ICD-10-CM | POA: Diagnosis not present

## 2014-01-04 ENCOUNTER — Ambulatory Visit (INDEPENDENT_AMBULATORY_CARE_PROVIDER_SITE_OTHER): Payer: MEDICARE | Admitting: Ophthalmology

## 2014-01-04 DIAGNOSIS — E1139 Type 2 diabetes mellitus with other diabetic ophthalmic complication: Secondary | ICD-10-CM | POA: Diagnosis not present

## 2014-01-04 DIAGNOSIS — I10 Essential (primary) hypertension: Secondary | ICD-10-CM | POA: Diagnosis not present

## 2014-01-04 DIAGNOSIS — H35039 Hypertensive retinopathy, unspecified eye: Secondary | ICD-10-CM | POA: Diagnosis not present

## 2014-01-04 DIAGNOSIS — E11319 Type 2 diabetes mellitus with unspecified diabetic retinopathy without macular edema: Secondary | ICD-10-CM | POA: Diagnosis not present

## 2014-01-04 DIAGNOSIS — E1165 Type 2 diabetes mellitus with hyperglycemia: Secondary | ICD-10-CM

## 2014-01-04 DIAGNOSIS — H43819 Vitreous degeneration, unspecified eye: Secondary | ICD-10-CM

## 2014-01-13 ENCOUNTER — Encounter: Payer: Self-pay | Admitting: Gastroenterology

## 2014-01-14 ENCOUNTER — Encounter (HOSPITAL_COMMUNITY): Payer: Self-pay | Admitting: Emergency Medicine

## 2014-01-14 ENCOUNTER — Emergency Department (HOSPITAL_COMMUNITY)
Admission: EM | Admit: 2014-01-14 | Discharge: 2014-01-14 | Disposition: A | Discharge status: Home / Self Care | Payer: MEDICARE | Attending: Emergency Medicine | Admitting: Emergency Medicine

## 2014-01-14 DIAGNOSIS — Z794 Long term (current) use of insulin: Secondary | ICD-10-CM | POA: Diagnosis not present

## 2014-01-14 DIAGNOSIS — R110 Nausea: Secondary | ICD-10-CM | POA: Insufficient documentation

## 2014-01-14 DIAGNOSIS — K219 Gastro-esophageal reflux disease without esophagitis: Secondary | ICD-10-CM | POA: Insufficient documentation

## 2014-01-14 DIAGNOSIS — I10 Essential (primary) hypertension: Secondary | ICD-10-CM | POA: Diagnosis not present

## 2014-01-14 DIAGNOSIS — N390 Urinary tract infection, site not specified: Secondary | ICD-10-CM | POA: Insufficient documentation

## 2014-01-14 DIAGNOSIS — E1139 Type 2 diabetes mellitus with other diabetic ophthalmic complication: Secondary | ICD-10-CM | POA: Diagnosis not present

## 2014-01-14 DIAGNOSIS — R42 Dizziness and giddiness: Secondary | ICD-10-CM | POA: Diagnosis not present

## 2014-01-14 DIAGNOSIS — Z8709 Personal history of other diseases of the respiratory system: Secondary | ICD-10-CM | POA: Insufficient documentation

## 2014-01-14 DIAGNOSIS — Z79899 Other long term (current) drug therapy: Secondary | ICD-10-CM | POA: Insufficient documentation

## 2014-01-14 LAB — URINE MICROSCOPIC-ADD ON

## 2014-01-14 LAB — CBC WITH DIFFERENTIAL/PLATELET
BASOS PCT: 0 % (ref 0–1)
Basophils Absolute: 0 10*3/uL (ref 0.0–0.1)
Eosinophils Absolute: 0.1 10*3/uL (ref 0.0–0.7)
Eosinophils Relative: 2 % (ref 0–5)
HCT: 41.1 % (ref 36.0–46.0)
HEMOGLOBIN: 13.4 g/dL (ref 12.0–15.0)
LYMPHS ABS: 1.3 10*3/uL (ref 0.7–4.0)
Lymphocytes Relative: 29 % (ref 12–46)
MCH: 28.3 pg (ref 26.0–34.0)
MCHC: 32.6 g/dL (ref 30.0–36.0)
MCV: 86.7 fL (ref 78.0–100.0)
MONOS PCT: 7 % (ref 3–12)
Monocytes Absolute: 0.3 10*3/uL (ref 0.1–1.0)
NEUTROS ABS: 2.8 10*3/uL (ref 1.7–7.7)
NEUTROS PCT: 62 % (ref 43–77)
Platelets: 164 10*3/uL (ref 150–400)
RBC: 4.74 MIL/uL (ref 3.87–5.11)
RDW: 14.3 % (ref 11.5–15.5)
WBC: 4.5 10*3/uL (ref 4.0–10.5)

## 2014-01-14 LAB — COMPREHENSIVE METABOLIC PANEL
ALBUMIN: 3.8 g/dL (ref 3.5–5.2)
ALT: 28 U/L (ref 0–35)
ANION GAP: 12 (ref 5–15)
AST: 26 U/L (ref 0–37)
Alkaline Phosphatase: 70 U/L (ref 39–117)
BUN: 12 mg/dL (ref 6–23)
CO2: 26 mEq/L (ref 19–32)
CREATININE: 0.69 mg/dL (ref 0.50–1.10)
Calcium: 10.2 mg/dL (ref 8.4–10.5)
Chloride: 101 mEq/L (ref 96–112)
GFR calc Af Amer: >90 mL/min (ref >90)
GFR calc non Af Amer: 81 mL/min — ABNORMAL LOW (ref >90)
Glucose, Bld: 206 mg/dL — ABNORMAL HIGH (ref 70–99)
Potassium: 4.3 mEq/L (ref 3.7–5.3)
Sodium: 139 mEq/L (ref 137–147)
TOTAL PROTEIN: 7.1 g/dL (ref 6.0–8.3)
Total Bilirubin: 0.4 mg/dL (ref 0.3–1.2)

## 2014-01-14 LAB — URINALYSIS, ROUTINE W REFLEX MICROSCOPIC
Bilirubin Urine: NEGATIVE
GLUCOSE, UA: NEGATIVE mg/dL
Ketones, ur: NEGATIVE mg/dL
Nitrite: POSITIVE — AB
Protein, ur: NEGATIVE mg/dL
SPECIFIC GRAVITY, URINE: 1.008 (ref 1.005–1.030)
UROBILINOGEN UA: 0.2 mg/dL (ref 0.0–1.0)
pH: 7 (ref 5.0–8.0)

## 2014-01-14 LAB — LIPASE, BLOOD: LIPASE: 15 U/L (ref 11–59)

## 2014-01-14 MED ORDER — GI COCKTAIL ~~LOC~~
30.0000 mL | Freq: Once | ORAL | Status: AC
  Administered 2014-01-14: 30 mL via ORAL
  Filled 2014-01-14: qty 30

## 2014-01-14 MED ORDER — CEPHALEXIN 500 MG PO CAPS: 500.0000 mg | ORAL_CAPSULE | Freq: Two times a day (BID) | ORAL | Status: DC

## 2014-01-14 NOTE — ED Provider Notes (Signed)
CSN: [id number]     Arrival date & time 01/14/14  4098 History   First MD Initiated Contact with Patient 01/14/14 [phone]     Chief Complaint  Patient presents with  . Dizziness  . Nausea     (Consider location/radiation/quality/duration/timing/severity/associated sxs/prior Treatment) HPI Comments: 80 yo female presenting with severe, constant, gradual onset nausea which started yesterday.  She has had similar symptoms in the past, which she has attributed to GERD.  In referring to her symptoms today, she is convinced that this is her GERD as well.  She denies any abdominal pain.  Nexium helps her symptoms.   She did endorse dizziness, but on further history, she reports that she gets dizzy every morning for a little while until she can get her equilibrium.  She typically has to stand up slowly, then eat breakfast, then she feels normal again.    She denies fevers, abdominal pain, vomiting, or constipation/diarrhea.     Past Medical History  Diagnosis Date  . Unspecified hemorrhoids without mention of complication   . Other constipation   . Dyspepsia and other specified disorders of function of stomach   . Allergic rhinitis, cause unspecified   . Unspecified essential hypertension   . Type II or unspecified type diabetes mellitus without mention of complication, not stated as uncontrolled   . Type II or unspecified type diabetes mellitus with ophthalmic manifestations, not stated as uncontrolled   . GERD (gastroesophageal reflux disease)   . Chronic pancreatitis   . Family history of malignant neoplasm of gastrointestinal tract    Past Surgical History  Procedure Laterality Date  . Abdominal hysterectomy    . Cesarean section      X two   Family History  Problem Relation Age of Onset  . Heart disease Father   . Colon cancer Sister     and Several Aunts   History  Substance Use Topics  . Smoking status: Never Smoker   . Smokeless tobacco: Not on file  . Alcohol Use: No    OB History   Grav Para Term Preterm Abortions TAB SAB Ect Mult Living                 Review of Systems  Constitutional: Negative for fever.  Respiratory: Negative for cough and shortness of breath.   Cardiovascular: Negative for chest pain.  Gastrointestinal: Positive for nausea. Negative for vomiting, abdominal pain and diarrhea.  All other systems reviewed and are negative.     Allergies  Review of patient's allergies indicates no known allergies.  Home Medications   Prior to Admission medications   Medication Sig Start Date End Date Taking? Authorizing Provider  acetaminophen (TYLENOL) 500 MG tablet Take 250 mg by mouth every 6 (six) hours as needed for moderate pain.   Yes Historical Provider, MD  Calcium Carbonate-Vitamin D (CALCIUM + D PO) Take 1 tablet by mouth daily.   Yes Historical Provider, MD  citalopram (CELEXA) 20 MG tablet Take 20 mg by mouth daily.  01/04/14  Yes Historical Provider, MD  docusate sodium (COLACE) 100 MG capsule Take 100 mg by mouth daily.   Yes Historical Provider, MD  ergocalciferol (VITAMIN D2) 50000 UNITS capsule Take 50,000 Units by mouth once a week. Take one by mouth once a week Every Sunday   Yes Historical Provider, MD  esomeprazole (NEXIUM) 40 MG capsule Take 40 mg by mouth daily at 12 noon.   Yes Historical Provider, MD  hydroxypropyl methylcellulose (ISOPTO TEARS) 2.5 %  ophthalmic solution Place 1 drop into both eyes at bedtime.   Yes Historical Provider, MD  insulin lispro protamine-insulin lispro (HUMALOG 75/25) (75-25) 100 UNIT/ML SUSP Inject 9-14 Units into the skin 2 (two) times daily with a meal. 9 units in the morning 14 units in the evening   Yes Historical Provider, MD  losartan-hydrochlorothiazide (HYZAAR) 100-12.5 MG per tablet Take 1 tablet by mouth daily.    Yes Historical Provider, MD  pioglitazone (ACTOS) 30 MG tablet Take 30 mg by mouth daily.    Yes Historical Provider, MD  potassium chloride SA (K-DUR,KLOR-CON) 20 MEQ  tablet Take 20 mEq by mouth daily.    Yes Historical Provider, MD  pravastatin (PRAVACHOL) 80 MG tablet Take 80 mg by mouth at bedtime.   Yes Historical Provider, MD   BP 136/42  Pulse 81  Temp(Src) 98.2 F (36.8 C) (Oral)  Resp 20  SpO2 100% Physical Exam  Nursing note and vitals reviewed. Constitutional: She is oriented to person, place, and time. She appears well-developed and well-nourished. No distress.  HENT:  Head: Normocephalic and atraumatic.  Mouth/Throat: Oropharynx is clear and moist.  Eyes: Conjunctivae are normal. Pupils are equal, round, and reactive to light. No scleral icterus.  Neck: Neck supple.  Cardiovascular: Normal rate, regular rhythm, normal heart sounds and intact distal pulses.   No murmur heard. Pulmonary/Chest: Effort normal and breath sounds normal. No stridor. No respiratory distress. She has no rales.  Abdominal: Soft. Bowel sounds are normal. She exhibits no distension. There is no tenderness. There is no rebound and no guarding.  Musculoskeletal: Normal range of motion.  Neurological: She is alert and oriented to person, place, and time. Gait normal.  Skin: Skin is warm and dry. No rash noted.  Psychiatric: She has a normal mood and affect. Her behavior is normal.    ED Course  Procedures (including critical care time) Labs Review Labs Reviewed  COMPREHENSIVE METABOLIC PANEL - Abnormal; Notable for the following:    Glucose, Bld 206 (*)    GFR calc non Af Amer 81 (*)    All other components within normal limits  URINALYSIS, ROUTINE W REFLEX MICROSCOPIC - Abnormal; Notable for the following:    APPearance CLOUDY (*)    Hgb urine dipstick SMALL (*)    Nitrite POSITIVE (*)    Leukocytes, UA LARGE (*)    All other components within normal limits  URINE MICROSCOPIC-ADD ON - Abnormal; Notable for the following:    Bacteria, UA MANY (*)    All other components within normal limits  LIPASE, BLOOD  CBC WITH DIFFERENTIAL    Imaging Review No  results found.   EKG Interpretation   Date/Time:  Thursday January 14 2014 09:37:18 EDT Ventricular Rate:  77 PR Interval:  158 QRS Duration: 79 QT Interval:  400 QTC Calculation: 453 R Axis:   69 Text Interpretation:  Sinus rhythm No significant change was found  Confirmed by [facility]  MD, TREY ([id number]) on 01/14/2014 9:47:55 AM      MDM   Final diagnoses:  Nausea  Urinary tract infection without hematuria, site unspecified    80 yo female with nausea which she attributes to her GERD.  She also states that she is concerned about a family history of pancreatic cancer.  She has no abdominal pain and no abdominal tenderness on exam.  She ambulated down the hall without difficulty or ataxia.  Plan to check labs and treat with GI cocktail.   On recheck, pt feels  better.  Abdominal exam still completely benign.  She has a UTI, which may be contributing to her nausea.  She also thinks she is feeling bad because she ate a greasy hamburger and chocolate yesterday.  Either way, she appears stable for discharge home.  As she has no abdominal pain and no abdominal tenderness on repeat exams, I don't think she needs abdominal imaging.    Candyce ChurnJohn David Humbert Morozov III, MD 01/14/14 1235

## 2014-01-14 NOTE — ED Notes (Signed)
Pt states she is having dizziness / nausea since yesterday.  States she thinks it is her pancrease.  Points to epigastric area for discomfort.  EKG NSR in triage.  No vomiting or fever.

## 2014-01-14 NOTE — ED Notes (Signed)
Pt escorted to discharge window. Pt verbalized understanding discharge instructions. In no acute distress.  

## 2014-01-14 NOTE — Discharge Instructions (Signed)

## 2014-01-14 NOTE — ED Notes (Signed)
md at bedside  Pt alert and oriented x4. Respirations even and unlabored, bilateral symmetrical rise and fall of chest. Skin warm and dry. In no acute distress. Denies needs.   

## 2014-01-31 IMAGING — CR DG FINGER INDEX 2+V*R*
3 series · 3 of 3 positions shown · non-contrast
Comparison: None.

CLINICAL DATA: Puncture wound to index finger.  Pain and swelling.

RIGHT INDEX FINGER 2+V

[x finger pa right]
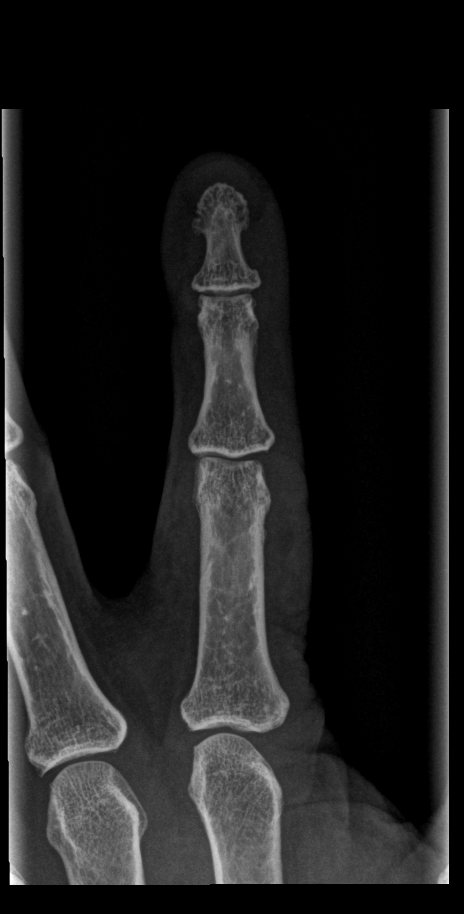

[x finger obl right]
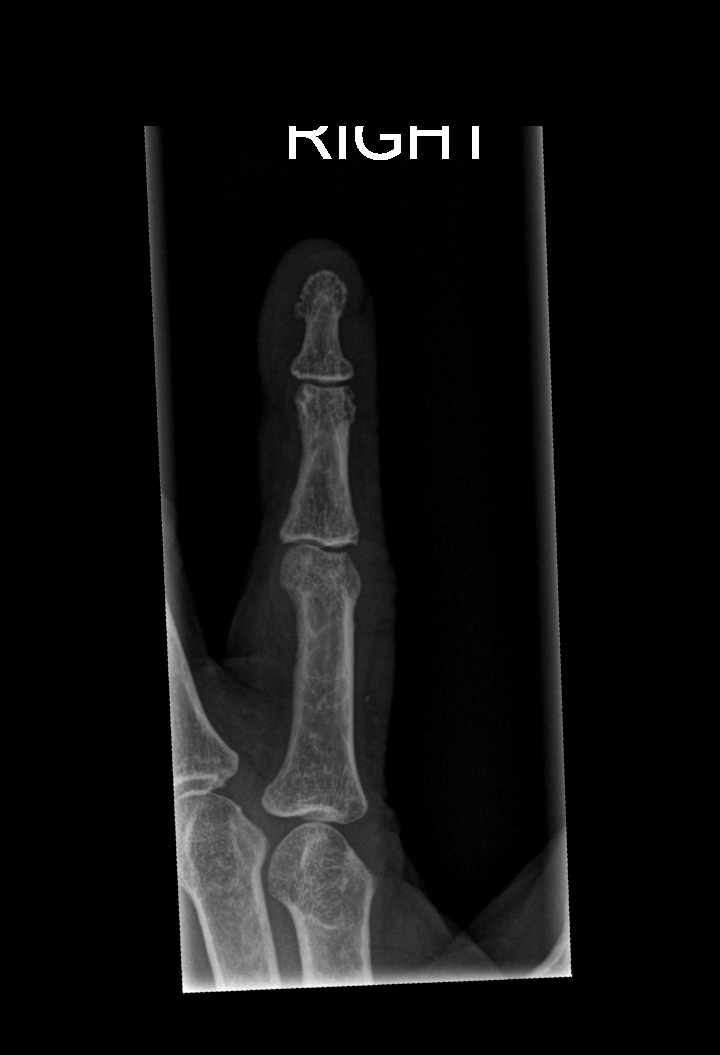

[x finger lat right]
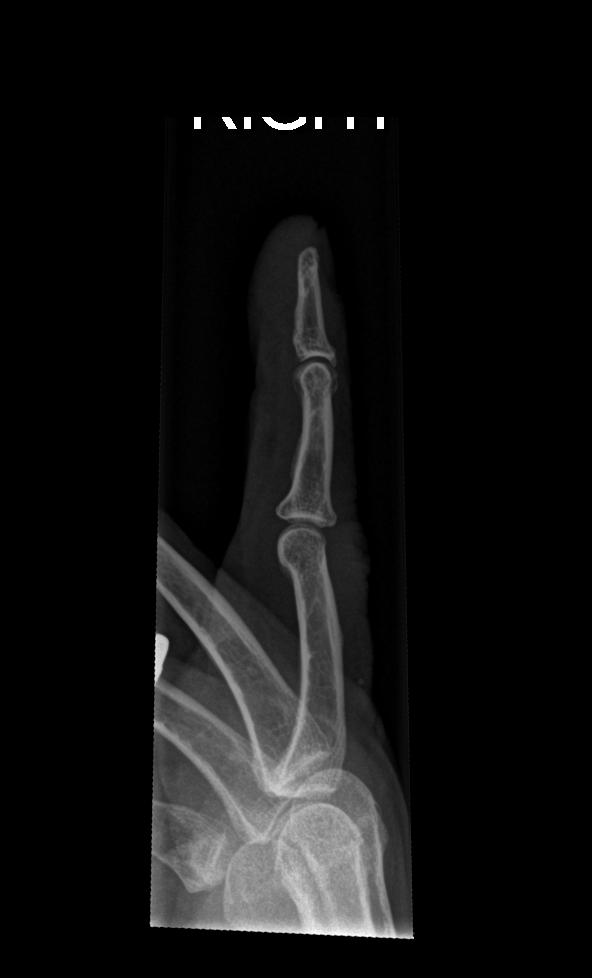

[3 of 3 positions shown; findings below may reference images not displayed]

FINDINGS: Mild soft tissue swelling is seen involving the distal
index finger.  No evidence of radiopaque foreign body.  No evidence
of fracture or dislocation.
IMPRESSION: Mild soft tissue swelling.  No evidence of fracture or radiopaque
foreign body.

## 2014-03-24 ENCOUNTER — Encounter: Payer: Self-pay | Admitting: Gastroenterology

## 2014-03-24 ENCOUNTER — Ambulatory Visit (INDEPENDENT_AMBULATORY_CARE_PROVIDER_SITE_OTHER): Payer: MEDICARE | Admitting: Gastroenterology

## 2014-03-24 VITALS — BP 118/62 | HR 66 | Ht 59.5 in | Wt 145.0 lb

## 2014-03-24 DIAGNOSIS — K219 Gastro-esophageal reflux disease without esophagitis: Secondary | ICD-10-CM

## 2014-03-24 MED ORDER — ESOMEPRAZOLE MAGNESIUM 40 MG PO CPDR: 40.0000 mg | DELAYED_RELEASE_CAPSULE | Freq: Two times a day (BID) | ORAL | Status: DC

## 2014-03-24 NOTE — Progress Notes (Signed)
    History of Present Illness: This is a 80 year old female previously followed by Dr. Jarold MottoPatterson last seen in April 2012. She has a history of GERD which was previously well controlled on dexlansoprazole 60 mg daily. At some point she changed to Nexium OTC 20 mg daily and her symptoms have not been well controlled. She relates frequent postprandial belching.  Current Medications, Allergies, Past Medical History, Past Surgical History, Family History and Social History were reviewed in [facility] electronic medical record.  Physical Exam: General: Well developed , well nourished, no acute distress Head: Normocephalic and atraumatic Eyes:  sclerae anicteric, EOMI Ears: Normal auditory acuity Mouth: No deformity or lesions Lungs: Clear throughout to auscultation Heart: Regular rate and rhythm; no murmurs, rubs or bruits Abdomen: Soft, non tender and non distended. No masses, hepatosplenomegaly or hernias noted. Normal Bowel sounds Musculoskeletal: Symmetrical with no gross deformities  Pulses:  Normal pulses noted Extremities: No clubbing, cyanosis, edema or deformities noted Neurological: Alert oriented x 4, grossly nonfocal Psychological:  Alert and cooperative. Normal mood and affect  Assessment and Recommendations:  1. GERD. Discontinue Nexium 20 mg OTC qd and begin Nexium/esomeprazole 40 mg twice daily. Unfortunately Dexliant/dexlansoprazole 60 mg is not covered by her insurance. Follow standard antireflux measures. If postprandial belching persists begin the use of Gas-X with meals. Followup with PCP for ongoing management. Followup with GI as needed.

## 2014-03-24 NOTE — Patient Instructions (Addendum)
Stop taking your over the counter Nexium.  We have sent the following medications to your pharmacy for you to pick up at your convenience: Nexium 40 mg one tablet by mouth twice daily.   Patient advised to avoid spicy, acidic, citrus, chocolate, mints, fruit and fruit juices.  Limit the intake of caffeine, alcohol and Soda.  Don't exercise too soon after eating.  Don't lie down within 3-4 hours of eating.  Elevate the head of your bed.  Continue follow up with your Primary Care Physician.   cc: Hali MarryMichael Altheimer, MD

## 2014-03-29 DIAGNOSIS — E785 Hyperlipidemia, unspecified: Secondary | ICD-10-CM | POA: Diagnosis not present

## 2014-03-29 DIAGNOSIS — E1139 Type 2 diabetes mellitus with other diabetic ophthalmic complication: Secondary | ICD-10-CM | POA: Diagnosis not present

## 2014-03-29 DIAGNOSIS — E559 Vitamin D deficiency, unspecified: Secondary | ICD-10-CM | POA: Diagnosis not present

## 2014-04-06 ENCOUNTER — Telehealth: Payer: Self-pay | Admitting: *Deleted

## 2014-04-06 MED ORDER — PANTOPRAZOLE SODIUM 40 MG PO TBEC: 40.0000 mg | DELAYED_RELEASE_TABLET | Freq: Two times a day (BID) | ORAL | Status: DC

## 2014-04-06 NOTE — Telephone Encounter (Signed)
I have attempted prior authorization for Nexium (name brand) since generic Nexium was going to cost patient $200.00 out of pocket. Name brand Nexium has been denied by patient's insurance plan. They require patient to have tired and failed omperazole and pantoprazole prior to approving any other PPI's. Therefore, we will try patient on pantoprazole. New rx will be sent to patient's pharmacy in place of Nexium. Left message for patient to call back.

## 2014-04-06 NOTE — Telephone Encounter (Signed)
I have spoken to Ms.Cdebaca to advise of the information below. She verbalizes understanding and will pick up Protonix.

## 2014-04-13 DIAGNOSIS — E780 Pure hypercholesterolemia: Secondary | ICD-10-CM | POA: Diagnosis not present

## 2014-04-13 DIAGNOSIS — E11329 Type 2 diabetes mellitus with mild nonproliferative diabetic retinopathy without macular edema: Secondary | ICD-10-CM | POA: Diagnosis not present

## 2014-04-13 DIAGNOSIS — E1165 Type 2 diabetes mellitus with hyperglycemia: Secondary | ICD-10-CM | POA: Diagnosis not present

## 2014-04-13 DIAGNOSIS — Z23 Encounter for immunization: Secondary | ICD-10-CM | POA: Diagnosis not present

## 2014-04-13 DIAGNOSIS — Z794 Long term (current) use of insulin: Secondary | ICD-10-CM | POA: Diagnosis not present

## 2014-04-13 DIAGNOSIS — E559 Vitamin D deficiency, unspecified: Secondary | ICD-10-CM | POA: Diagnosis not present

## 2014-05-07 DIAGNOSIS — H903 Sensorineural hearing loss, bilateral: Secondary | ICD-10-CM | POA: Diagnosis not present

## 2014-05-07 DIAGNOSIS — M2660 Temporomandibular joint disorder, unspecified: Secondary | ICD-10-CM | POA: Diagnosis not present

## 2014-05-25 DIAGNOSIS — E11329 Type 2 diabetes mellitus with mild nonproliferative diabetic retinopathy without macular edema: Secondary | ICD-10-CM | POA: Diagnosis not present

## 2014-05-25 DIAGNOSIS — Z794 Long term (current) use of insulin: Secondary | ICD-10-CM | POA: Diagnosis not present

## 2014-05-25 DIAGNOSIS — Z23 Encounter for immunization: Secondary | ICD-10-CM | POA: Diagnosis not present

## 2014-05-25 DIAGNOSIS — E1165 Type 2 diabetes mellitus with hyperglycemia: Secondary | ICD-10-CM | POA: Diagnosis not present

## 2014-05-25 DIAGNOSIS — E559 Vitamin D deficiency, unspecified: Secondary | ICD-10-CM | POA: Diagnosis not present

## 2014-05-25 DIAGNOSIS — E780 Pure hypercholesterolemia: Secondary | ICD-10-CM | POA: Diagnosis not present

## 2014-07-12 DIAGNOSIS — E1165 Type 2 diabetes mellitus with hyperglycemia: Secondary | ICD-10-CM | POA: Diagnosis not present

## 2014-07-12 DIAGNOSIS — E780 Pure hypercholesterolemia: Secondary | ICD-10-CM | POA: Diagnosis not present

## 2014-07-22 DIAGNOSIS — E1165 Type 2 diabetes mellitus with hyperglycemia: Secondary | ICD-10-CM | POA: Diagnosis not present

## 2014-07-22 DIAGNOSIS — E559 Vitamin D deficiency, unspecified: Secondary | ICD-10-CM | POA: Diagnosis not present

## 2014-07-22 DIAGNOSIS — Z794 Long term (current) use of insulin: Secondary | ICD-10-CM | POA: Diagnosis not present

## 2014-07-22 DIAGNOSIS — E11329 Type 2 diabetes mellitus with mild nonproliferative diabetic retinopathy without macular edema: Secondary | ICD-10-CM | POA: Diagnosis not present

## 2014-07-22 DIAGNOSIS — E780 Pure hypercholesterolemia: Secondary | ICD-10-CM | POA: Diagnosis not present

## 2014-10-20 DIAGNOSIS — E1165 Type 2 diabetes mellitus with hyperglycemia: Secondary | ICD-10-CM | POA: Diagnosis not present

## 2014-10-20 DIAGNOSIS — E780 Pure hypercholesterolemia: Secondary | ICD-10-CM | POA: Diagnosis not present

## 2014-10-25 DIAGNOSIS — E11329 Type 2 diabetes mellitus with mild nonproliferative diabetic retinopathy without macular edema: Secondary | ICD-10-CM | POA: Diagnosis not present

## 2014-10-25 DIAGNOSIS — Z794 Long term (current) use of insulin: Secondary | ICD-10-CM | POA: Diagnosis not present

## 2014-10-25 DIAGNOSIS — E559 Vitamin D deficiency, unspecified: Secondary | ICD-10-CM | POA: Diagnosis not present

## 2014-10-25 DIAGNOSIS — E780 Pure hypercholesterolemia: Secondary | ICD-10-CM | POA: Diagnosis not present

## 2014-10-25 DIAGNOSIS — E1165 Type 2 diabetes mellitus with hyperglycemia: Secondary | ICD-10-CM | POA: Diagnosis not present

## 2015-01-10 ENCOUNTER — Ambulatory Visit (INDEPENDENT_AMBULATORY_CARE_PROVIDER_SITE_OTHER): Payer: MEDICARE | Admitting: Ophthalmology

## 2015-01-10 DIAGNOSIS — I10 Essential (primary) hypertension: Secondary | ICD-10-CM

## 2015-01-10 DIAGNOSIS — H35033 Hypertensive retinopathy, bilateral: Secondary | ICD-10-CM | POA: Diagnosis not present

## 2015-01-10 DIAGNOSIS — E11319 Type 2 diabetes mellitus with unspecified diabetic retinopathy without macular edema: Secondary | ICD-10-CM

## 2015-01-10 DIAGNOSIS — H43813 Vitreous degeneration, bilateral: Secondary | ICD-10-CM

## 2015-01-10 DIAGNOSIS — E11329 Type 2 diabetes mellitus with mild nonproliferative diabetic retinopathy without macular edema: Secondary | ICD-10-CM

## 2015-01-24 DIAGNOSIS — E780 Pure hypercholesterolemia: Secondary | ICD-10-CM | POA: Diagnosis not present

## 2015-01-24 DIAGNOSIS — E1165 Type 2 diabetes mellitus with hyperglycemia: Secondary | ICD-10-CM | POA: Diagnosis not present

## 2015-01-27 DIAGNOSIS — E1165 Type 2 diabetes mellitus with hyperglycemia: Secondary | ICD-10-CM | POA: Diagnosis not present

## 2015-01-27 DIAGNOSIS — E11329 Type 2 diabetes mellitus with mild nonproliferative diabetic retinopathy without macular edema: Secondary | ICD-10-CM | POA: Diagnosis not present

## 2015-01-27 DIAGNOSIS — E780 Pure hypercholesterolemia: Secondary | ICD-10-CM | POA: Diagnosis not present

## 2015-01-27 DIAGNOSIS — E559 Vitamin D deficiency, unspecified: Secondary | ICD-10-CM | POA: Diagnosis not present

## 2015-01-27 DIAGNOSIS — Z794 Long term (current) use of insulin: Secondary | ICD-10-CM | POA: Diagnosis not present

## 2015-04-19 ENCOUNTER — Other Ambulatory Visit: Payer: Self-pay

## 2015-04-19 MED ORDER — PANTOPRAZOLE SODIUM 40 MG PO TBEC: 40.0000 mg | DELAYED_RELEASE_TABLET | Freq: Two times a day (BID) | ORAL | Status: DC

## 2015-04-25 DIAGNOSIS — E1165 Type 2 diabetes mellitus with hyperglycemia: Secondary | ICD-10-CM | POA: Diagnosis not present

## 2015-04-25 DIAGNOSIS — E7800 Pure hypercholesterolemia, unspecified: Secondary | ICD-10-CM | POA: Diagnosis not present

## 2015-04-25 DIAGNOSIS — E559 Vitamin D deficiency, unspecified: Secondary | ICD-10-CM | POA: Diagnosis not present

## 2015-04-28 DIAGNOSIS — Z794 Long term (current) use of insulin: Secondary | ICD-10-CM | POA: Diagnosis not present

## 2015-04-28 DIAGNOSIS — E113293 Type 2 diabetes mellitus with mild nonproliferative diabetic retinopathy without macular edema, bilateral: Secondary | ICD-10-CM | POA: Diagnosis not present

## 2015-04-28 DIAGNOSIS — E1165 Type 2 diabetes mellitus with hyperglycemia: Secondary | ICD-10-CM | POA: Diagnosis not present

## 2015-04-28 DIAGNOSIS — E7800 Pure hypercholesterolemia, unspecified: Secondary | ICD-10-CM | POA: Diagnosis not present

## 2015-04-28 DIAGNOSIS — Z23 Encounter for immunization: Secondary | ICD-10-CM | POA: Diagnosis not present

## 2015-04-28 DIAGNOSIS — E559 Vitamin D deficiency, unspecified: Secondary | ICD-10-CM | POA: Diagnosis not present

## 2015-05-13 ENCOUNTER — Other Ambulatory Visit: Payer: Self-pay

## 2015-05-13 MED ORDER — PANTOPRAZOLE SODIUM 40 MG PO TBEC: 40.0000 mg | DELAYED_RELEASE_TABLET | Freq: Two times a day (BID) | ORAL | Status: DC

## 2015-06-16 ENCOUNTER — Telehealth: Payer: Self-pay | Admitting: Gastroenterology

## 2015-06-16 MED ORDER — PANTOPRAZOLE SODIUM 40 MG PO TBEC: 40.0000 mg | DELAYED_RELEASE_TABLET | Freq: Two times a day (BID) | ORAL | Status: DC

## 2015-06-16 NOTE — Telephone Encounter (Signed)
Prescription has been sent to patient's pharmacy and patient notified to keep appt for any further refills.

## 2015-06-28 ENCOUNTER — Telehealth: Payer: Self-pay | Admitting: Gastroenterology

## 2015-06-28 ENCOUNTER — Encounter (HOSPITAL_COMMUNITY): Payer: Self-pay | Admitting: Emergency Medicine

## 2015-06-28 ENCOUNTER — Emergency Department (HOSPITAL_COMMUNITY)
Admission: EM | Admit: 2015-06-28 | Discharge: 2015-06-28 | Disposition: A | Discharge status: Home / Self Care | Payer: MEDICARE | Attending: Emergency Medicine | Admitting: Emergency Medicine

## 2015-06-28 DIAGNOSIS — Z79899 Other long term (current) drug therapy: Secondary | ICD-10-CM | POA: Insufficient documentation

## 2015-06-28 DIAGNOSIS — I10 Essential (primary) hypertension: Secondary | ICD-10-CM | POA: Diagnosis not present

## 2015-06-28 DIAGNOSIS — Z7984 Long term (current) use of oral hypoglycemic drugs: Secondary | ICD-10-CM | POA: Insufficient documentation

## 2015-06-28 DIAGNOSIS — Z9889 Other specified postprocedural states: Secondary | ICD-10-CM | POA: Diagnosis not present

## 2015-06-28 DIAGNOSIS — Z7982 Long term (current) use of aspirin: Secondary | ICD-10-CM | POA: Insufficient documentation

## 2015-06-28 DIAGNOSIS — R110 Nausea: Secondary | ICD-10-CM

## 2015-06-28 DIAGNOSIS — K219 Gastro-esophageal reflux disease without esophagitis: Secondary | ICD-10-CM | POA: Diagnosis not present

## 2015-06-28 DIAGNOSIS — Z90710 Acquired absence of both cervix and uterus: Secondary | ICD-10-CM | POA: Diagnosis not present

## 2015-06-28 DIAGNOSIS — R1013 Epigastric pain: Secondary | ICD-10-CM | POA: Diagnosis present

## 2015-06-28 DIAGNOSIS — Z794 Long term (current) use of insulin: Secondary | ICD-10-CM | POA: Diagnosis not present

## 2015-06-28 DIAGNOSIS — Z880 Allergy status to penicillin: Secondary | ICD-10-CM | POA: Insufficient documentation

## 2015-06-28 DIAGNOSIS — E119 Type 2 diabetes mellitus without complications: Secondary | ICD-10-CM | POA: Diagnosis not present

## 2015-06-28 DIAGNOSIS — N390 Urinary tract infection, site not specified: Secondary | ICD-10-CM | POA: Diagnosis not present

## 2015-06-28 LAB — COMPREHENSIVE METABOLIC PANEL
ALT: 21 U/L (ref 14–54)
ANION GAP: 9 (ref 5–15)
AST: 22 U/L (ref 15–41)
Albumin: 4 g/dL (ref 3.5–5.0)
Alkaline Phosphatase: 56 U/L (ref 38–126)
BUN: 10 mg/dL (ref 6–20)
CHLORIDE: 105 mmol/L (ref 101–111)
CO2: 28 mmol/L (ref 22–32)
CREATININE: 0.86 mg/dL (ref 0.44–1.00)
Calcium: 10.4 mg/dL — ABNORMAL HIGH (ref 8.9–10.3)
Glucose, Bld: 226 mg/dL — ABNORMAL HIGH (ref 65–99)
POTASSIUM: 3.9 mmol/L (ref 3.5–5.1)
SODIUM: 142 mmol/L (ref 135–145)
Total Bilirubin: 0.5 mg/dL (ref 0.3–1.2)
Total Protein: 6.8 g/dL (ref 6.5–8.1)

## 2015-06-28 LAB — URINE MICROSCOPIC-ADD ON

## 2015-06-28 LAB — URINALYSIS, ROUTINE W REFLEX MICROSCOPIC
Bilirubin Urine: NEGATIVE
GLUCOSE, UA: NEGATIVE mg/dL
Ketones, ur: NEGATIVE mg/dL
Nitrite: NEGATIVE
PROTEIN: NEGATIVE mg/dL
SPECIFIC GRAVITY, URINE: 1.007 (ref 1.005–1.030)
pH: 7 (ref 5.0–8.0)

## 2015-06-28 LAB — CBC
HEMATOCRIT: 43.2 % (ref 36.0–46.0)
HEMOGLOBIN: 13.5 g/dL (ref 12.0–15.0)
MCH: 28.5 pg (ref 26.0–34.0)
MCHC: 31.3 g/dL (ref 30.0–36.0)
MCV: 91.3 fL (ref 78.0–100.0)
PLATELETS: 177 10*3/uL (ref 150–400)
RBC: 4.73 MIL/uL (ref 3.87–5.11)
RDW: 14.1 % (ref 11.5–15.5)
WBC: 5.1 10*3/uL (ref 4.0–10.5)

## 2015-06-28 LAB — LIPASE, BLOOD: LIPASE: 19 U/L (ref 11–51)

## 2015-06-28 MED ORDER — CEPHALEXIN 500 MG PO CAPS
500.0000 mg | ORAL_CAPSULE | Freq: Three times a day (TID) | ORAL | Status: DC
Start: 2015-06-28 — End: 2020-12-19

## 2015-06-28 MED ORDER — GI COCKTAIL ~~LOC~~
30.0000 mL | Freq: Once | ORAL | Status: AC
  Administered 2015-06-28: 30 mL via ORAL
  Filled 2015-06-28: qty 30

## 2015-06-28 NOTE — ED Notes (Signed)
Pt states her acid reflux has been acting up over the last three weeks. Causing her to belch and feel nauseous whenever she eats anything. Denies vomiting, diarrhea, fever/chills, SOB, CP

## 2015-06-28 NOTE — Telephone Encounter (Signed)
Olivia Roberts, patient daughter in law calling in regarding this. She states that patient has been complaining about GERD pain, nausea, gas, and abd pain. Olivia is wanting to know if patient needs to be seen in office or needs to go to ED.

## 2015-06-28 NOTE — ED Provider Notes (Signed)
CSN: [id number]     Arrival date & time 06/28/15  0930 History   First MD Initiated Contact with Patient 06/28/15 1023     Chief Complaint  Patient presents with  . Abdominal Pain  . Nausea     (Consider location/radiation/quality/duration/timing/severity/associated sxs/prior Treatment) Patient is a 82 y.o. female presenting with abdominal pain. The history is provided by the patient.  Abdominal Pain Pain location:  Epigastric Pain quality: bloating and cramping   Pain quality comment:  Gaseaousness Pain radiates to:  Does not radiate Pain severity:  Moderate Onset quality:  Gradual Duration:  2 weeks Timing:  Intermittent Progression:  Waxing and waning Chronicity:  New Context: diet changes (holiday foods)   Relieved by:  Nothing Worsened by:  Nothing tried Ineffective treatments:  None tried Associated symptoms: nausea   Associated symptoms: no constipation, no dysuria, no fever, no hematuria and no vomiting   Risk factors: being elderly     Past Medical History  Diagnosis Date  . Unspecified hemorrhoids without mention of complication   . Other constipation   . Dyspepsia and other specified disorders of function of stomach   . Allergic rhinitis, cause unspecified   . Unspecified essential hypertension   . Type II or unspecified type diabetes mellitus without mention of complication, not stated as uncontrolled   . Type II or unspecified type diabetes mellitus with ophthalmic manifestations, not stated as uncontrolled   . GERD (gastroesophageal reflux disease)   . Chronic pancreatitis [facility])    Past Surgical History  Procedure Laterality Date  . Abdominal hysterectomy    . Cesarean section      X two   Family History  Problem Relation Age of Onset  . Heart disease Father   . Colon cancer Sister     and Several Aunts   Social History  Substance Use Topics  . Smoking status: Never Smoker   . Smokeless tobacco: None  . Alcohol Use: No   OB History    No  data available     Review of Systems  Constitutional: Negative for fever.  Gastrointestinal: Positive for nausea and abdominal pain. Negative for vomiting and constipation.  Genitourinary: Negative for dysuria and hematuria.  All other systems reviewed and are negative.     Allergies  Penicillins  Home Medications   Prior to Admission medications   Medication Sig Start Date End Date Taking? Authorizing Provider  amLODipine (NORVASC) 5 MG tablet Take 5 mg by mouth daily.   Yes Historical Provider, MD  aspirin 81 MG tablet Take 81 mg by mouth daily.   Yes Historical Provider, MD  citalopram (CELEXA) 20 MG tablet Take 20 mg by mouth daily. 05/26/15  Yes Historical Provider, MD  docusate sodium (COLACE) 100 MG capsule Take 100 mg by mouth daily.   Yes Historical Provider, MD  ergocalciferol (VITAMIN D2) 50000 UNITS capsule Take 50,000 Units by mouth once a week. Take one by mouth once a week Every Sunday   Yes Historical Provider, MD  hydroxypropyl methylcellulose (ISOPTO TEARS) 2.5 % ophthalmic solution Place 1 drop into both eyes as needed for dry eyes.    Yes Historical Provider, MD  insulin lispro protamine-insulin lispro (HUMALOG 75/25) (75-25) 100 UNIT/ML SUSP Inject 13-14 Units into the skin 2 (two) times daily with a meal. Take 13 units every morning and 14 units every evening.   Yes Historical Provider, MD  losartan-hydrochlorothiazide (HYZAAR) 100-12.5 MG per tablet Take 1 tablet by mouth daily.    Yes  Historical Provider, MD  pantoprazole (PROTONIX) 40 MG tablet Take 1 tablet (40 mg total) by mouth 2 (two) times daily. 06/16/15  Yes Meryl DareMalcolm T Stark, MD  pioglitazone (ACTOS) 30 MG tablet Take 30 mg by mouth daily.    Yes Historical Provider, MD  potassium chloride SA (K-DUR,KLOR-CON) 20 MEQ tablet Take 20 mEq by mouth daily.    Yes Historical Provider, MD  pravastatin (PRAVACHOL) 80 MG tablet Take 80 mg by mouth at bedtime.   Yes Historical Provider, MD   BP 133/55 mmHg  Pulse  71  Temp(Src) 97.9 F (36.6 C) (Oral)  Resp 14  SpO2 100% Physical Exam  Constitutional: She is oriented to person, place, and time. She appears well-developed and well-nourished. No distress.  HENT:  Head: Normocephalic.  Eyes: Conjunctivae are normal.  Neck: Neck supple. No tracheal deviation present.  Cardiovascular: Normal rate, regular rhythm and normal heart sounds.   Pulmonary/Chest: Effort normal and breath sounds normal. No respiratory distress.  Abdominal: Soft. She exhibits no distension. There is no tenderness. There is no rebound and no guarding.  Neurological: She is alert and oriented to person, place, and time.  Skin: Skin is warm and dry.  Psychiatric: She has a normal mood and affect.  Vitals reviewed.   ED Course  Procedures (including critical care time) Labs Review Labs Reviewed  COMPREHENSIVE METABOLIC PANEL - Abnormal; Notable for the following:    Glucose, Bld 226 (*)    Calcium 10.4 (*)    All other components within normal limits  URINALYSIS, ROUTINE W REFLEX MICROSCOPIC (NOT AT [facility]) - Abnormal; Notable for the following:    APPearance CLOUDY (*)    Hgb urine dipstick TRACE (*)    Leukocytes, UA MODERATE (*)    All other components within normal limits  URINE MICROSCOPIC-ADD ON - Abnormal; Notable for the following:    Squamous Epithelial / LPF 0-5 (*)    Bacteria, UA MANY (*)    All other components within normal limits  URINE CULTURE  LIPASE, BLOOD  CBC    Imaging Review No results found. I have personally reviewed and evaluated these images and lab results as part of my medical decision-making.   EKG Interpretation None      MDM   Final diagnoses:  Nausea  Urinary tract infection without hematuria, site unspecified     82 year old female presents with what she is concerned is her typical GERD pain with persistent nausea. It appears when she presented here previously for similar symptoms she was diagnosed with UTI and her symptoms  cleared after empiric treatment for this. Urine is concerning for possible UTI today with moderate leukocytes. No evidence of acute pancreatitis or, No significant hematologic or metabolic abnormalities to explain symptoms. I recommended another empiric treatment with Keflex and monitoring of symptoms pending culture. Plan to follow up with PCP as needed and return precautions discussed for worsening or new concerning symptoms.    Lyndal Pulleyaniel Corrigan Kretschmer, MD 06/29/15 [phone]

## 2015-06-28 NOTE — Telephone Encounter (Signed)
Patient is in the ER.  Will await a return call

## 2015-06-28 NOTE — ED Notes (Signed)
Family at bedside. 

## 2015-06-28 NOTE — ED Notes (Signed)
MD at bedside. 

## 2015-06-28 NOTE — ED Notes (Signed)
Pt sts shes a diabetic and for 2 weeks has had gas, gerd, constipation and nausea, denies vomiting, diarrhea or fever

## 2015-06-28 NOTE — Discharge Instructions (Signed)
Antibiotic Medicine °Antibiotic medicines are used to treat infections caused by bacteria. They work by hurting or killing the germs that are making you sick. °HOW WILL MY MEDICINE BE PICKED? °There are many kinds of antibiotic medicines. To help your doctor pick one, tell your doctor if: °· You have any allergies. °· You are pregnant or plan to get pregnant. °· You are breastfeeding. °· You are taking any medicines. These include over-the-counter medicines, prescription medicines, and herbal remedies. °· You have a medical condition or problem. °If you have questions about why your medicine was picked, ask. °FOR HOW LONG SHOULD I TAKE MY MEDICINE? °Take your medicine for as long as your doctor tells you to. Do not stop taking it when you feel better. If you stop taking it too soon: °· You may start to feel sick again. °· Your infection may get harder to treat. °· New problems may develop. °WHAT IF I MISS A DOSE? °Try not to miss any doses of antibiotic medicine. If you miss a dose: °· Take the dose as soon as you can. °· If you are taking 2 doses a day, take the next dose in 5 to 6 hours. °· If you are taking 3 or more doses a day, take the next dose in 2 to 4 hours. Then go back to the normal schedule. °If you cannot take a missed dose, take the next dose on time. Then take the missed dose after you have taken all the doses as told by your doctor, as if you had one more dose left. °DOES THIS MEDICINE AFFECT BIRTH CONTROL? °Birth control pills may not work while you are on antibiotic medicines. If you are taking birth control pills, keep taking them as usual. Use a second form of birth control, such as a condom. Keep using the second form of birth control until you are finished with your current 1 month cycle of birth control pills. °GET HELP IF: °· You get worse. °· You do not feel better a few days after starting the medicine. °· You throw up (vomit). °· There are white patches in your mouth. °· You have new  joint pain after starting the medicine. °· You have new muscle aches after starting the medicine. °· You had a fever before starting the medicine, and it comes back. °· You have any symptoms of an allergic reaction, such as an itchy rash. If this happens, stop taking the medicine. °GET HELP RIGHT AWAY IF: °· Your pee (urine) turns dark or becomes blood-colored. °· Your skin turns yellow. °· You bruise or bleed easily. °· You have very bad watery poop (diarrhea) and cramps in your belly (abdomen). °· You have a very bad headache. °· You have signs of a very bad allergic reaction, such as: °¨ Trouble breathing. °¨ Wheezing. °¨ Swelling of the lips, tongue, or face. °¨ Fainting. °¨ Blisters on the skin or in the mouth. °If you have signs of a very bad allergic reaction, stop taking the antibiotic medicine right away. °  °This information is not intended to replace advice given to you by your health care provider. Make sure you discuss any questions you have with your health care provider. °  °Document Released: 03/20/2008 Document Revised: 03/02/2015 Document Reviewed: 10/27/2014 °Elsevier Interactive Patient Education ©2016 [employer] ° °

## 2015-07-01 LAB — URINE CULTURE

## 2015-07-02 ENCOUNTER — Telehealth (HOSPITAL_BASED_OUTPATIENT_CLINIC_OR_DEPARTMENT_OTHER): Payer: Self-pay | Admitting: Emergency Medicine

## 2015-07-02 NOTE — Telephone Encounter (Signed)
Post ED Visit - Positive Culture Follow-up  Culture report reviewed by antimicrobial stewardship pharmacist:  []  Enzo BiNathan Batchelder, Pharm.D. []  Celedonio MiyamotoJeremy Frens, Pharm.D., BCPS [x]  Garvin FilaMike Maccia, Pharm.D. []  Georgina PillionElizabeth Martin, Pharm.D., BCPS []  [place]risvilleMinh Pham, [address]Ph[address] BCPS, AAHIVP []  Estella HuskMichelle Turner, Pharm.D., BCPS, AAHIVP []  Tennis Mustassie Stewart, Pharm.D. []  Rob Oswaldo DoneVincent, [address]  Positive urine culture  Treated with cephalexin, organism sensitive to the same and no further patient follow-up is required at this time.  Berle MullMiller, Coco Sharpnack 07/02/2015, 3:00 PM

## 2015-07-27 ENCOUNTER — Encounter: Payer: Self-pay | Admitting: Gastroenterology

## 2015-07-27 ENCOUNTER — Ambulatory Visit (INDEPENDENT_AMBULATORY_CARE_PROVIDER_SITE_OTHER): Payer: MEDICARE | Admitting: Gastroenterology

## 2015-07-27 VITALS — BP 130/60 | HR 72 | Ht 59.5 in | Wt 163.0 lb

## 2015-07-27 DIAGNOSIS — K219 Gastro-esophageal reflux disease without esophagitis: Secondary | ICD-10-CM | POA: Diagnosis not present

## 2015-07-27 DIAGNOSIS — K5900 Constipation, unspecified: Secondary | ICD-10-CM | POA: Diagnosis not present

## 2015-07-27 MED ORDER — PANTOPRAZOLE SODIUM 40 MG PO TBEC: 40.0000 mg | DELAYED_RELEASE_TABLET | Freq: Two times a day (BID) | ORAL | Status: DC

## 2015-07-27 NOTE — Progress Notes (Signed)
    History of Present Illness: This is an 82 year old female returning for follow-up of GERD and constipation. Her reflux symptoms are under very good control on pantoprazole 40 mg twice daily. If she lowers this to once daily she has significant breakthrough symptoms. Chronic constipation is well controlled on Metamucil twice daily.   Current Medications, Allergies, Past Medical History, Past Surgical History, Family History and Social History were reviewed in [employer] record.  Physical Exam: General: Well developed, well nourished, no acute distress Head: Normocephalic and atraumatic Eyes:  sclerae anicteric, EOMI Ears: Normal auditory acuity Mouth: No deformity or lesions Lungs: Clear throughout to auscultation Heart: Regular rate and rhythm; no murmurs, rubs or bruits Abdomen: Soft, non tender and non distended. No masses, hepatosplenomegaly or hernias noted. Normal Bowel sounds Musculoskeletal: Symmetrical with no gross deformities  Pulses:  Normal pulses noted Extremities: No clubbing, cyanosis, edema or deformities noted Neurological: Alert oriented x 4, grossly nonfocal Psychological:  Alert and cooperative. Normal mood and affect  Assessment and Recommendations:  1. GERD.  Tinea standard antireflux measures head and pantoprazole 40 mg twice daily. REV in one year  2. Constipation. Continue Metamucil bid.

## 2015-07-27 NOTE — Patient Instructions (Signed)
We have sent the following medications to your pharmacy for you to pick up at your convenience: Protonix.  Thank you for choosing me and [facility].  Malcolm T. Stark, Jr., MD., FACG   

## 2015-08-01 DIAGNOSIS — E7800 Pure hypercholesterolemia, unspecified: Secondary | ICD-10-CM | POA: Diagnosis not present

## 2015-08-01 DIAGNOSIS — E559 Vitamin D deficiency, unspecified: Secondary | ICD-10-CM | POA: Diagnosis not present

## 2015-08-01 DIAGNOSIS — E1165 Type 2 diabetes mellitus with hyperglycemia: Secondary | ICD-10-CM | POA: Diagnosis not present

## 2015-08-04 DIAGNOSIS — E7800 Pure hypercholesterolemia, unspecified: Secondary | ICD-10-CM | POA: Diagnosis not present

## 2015-08-04 DIAGNOSIS — E113293 Type 2 diabetes mellitus with mild nonproliferative diabetic retinopathy without macular edema, bilateral: Secondary | ICD-10-CM | POA: Diagnosis not present

## 2015-08-04 DIAGNOSIS — E1165 Type 2 diabetes mellitus with hyperglycemia: Secondary | ICD-10-CM | POA: Diagnosis not present

## 2015-08-04 DIAGNOSIS — E559 Vitamin D deficiency, unspecified: Secondary | ICD-10-CM | POA: Diagnosis not present

## 2015-08-04 DIAGNOSIS — Z794 Long term (current) use of insulin: Secondary | ICD-10-CM | POA: Diagnosis not present

## 2015-11-01 DIAGNOSIS — E1165 Type 2 diabetes mellitus with hyperglycemia: Secondary | ICD-10-CM | POA: Diagnosis not present

## 2015-11-01 DIAGNOSIS — E559 Vitamin D deficiency, unspecified: Secondary | ICD-10-CM | POA: Diagnosis not present

## 2015-11-01 DIAGNOSIS — E7800 Pure hypercholesterolemia, unspecified: Secondary | ICD-10-CM | POA: Diagnosis not present

## 2015-11-03 DIAGNOSIS — E1165 Type 2 diabetes mellitus with hyperglycemia: Secondary | ICD-10-CM | POA: Diagnosis not present

## 2015-11-03 DIAGNOSIS — E113293 Type 2 diabetes mellitus with mild nonproliferative diabetic retinopathy without macular edema, bilateral: Secondary | ICD-10-CM | POA: Diagnosis not present

## 2015-11-03 DIAGNOSIS — E559 Vitamin D deficiency, unspecified: Secondary | ICD-10-CM | POA: Diagnosis not present

## 2015-11-03 DIAGNOSIS — Z794 Long term (current) use of insulin: Secondary | ICD-10-CM | POA: Diagnosis not present

## 2015-11-03 DIAGNOSIS — E7800 Pure hypercholesterolemia, unspecified: Secondary | ICD-10-CM | POA: Diagnosis not present

## 2015-11-25 ENCOUNTER — Telehealth: Payer: Self-pay | Admitting: Gastroenterology

## 2015-11-25 MED ORDER — RANITIDINE HCL 300 MG PO TABS: 300.0000 mg | ORAL_TABLET | Freq: Every day | ORAL | Status: DC

## 2015-11-25 NOTE — Telephone Encounter (Signed)
Continue pantoprazole 40 mg po bid ac Add ranitidine 300 mg hs Strict antireflux measures Schedule REV

## 2015-11-25 NOTE — Telephone Encounter (Signed)
Patient notified of recommendations and follow up scheduled for 01/19/16 9:15

## 2015-11-25 NOTE — Telephone Encounter (Signed)
Patient reports that she is having breakthrough reflux despite a antireflux diet and BID pantoprazole.  She reports burning and reflux of her food. She wonders if she should "be checked for pancreatic cancer?"  She feels she has had reflux so long that it should be "checked out"  Dr. Russella DarStark please advise

## 2015-12-06 ENCOUNTER — Telehealth: Payer: Self-pay | Admitting: Gastroenterology

## 2015-12-06 NOTE — Telephone Encounter (Signed)
No answer/machine I will continue to try and reach the patient  

## 2015-12-07 NOTE — Telephone Encounter (Signed)
No answer/machine.  I will continue to try and reach her 

## 2015-12-13 NOTE — Telephone Encounter (Signed)
Unable to reach pt

## 2016-01-16 ENCOUNTER — Ambulatory Visit (INDEPENDENT_AMBULATORY_CARE_PROVIDER_SITE_OTHER): Payer: MEDICARE | Admitting: Ophthalmology

## 2016-01-16 ENCOUNTER — Other Ambulatory Visit: Payer: Self-pay

## 2016-01-16 DIAGNOSIS — H35033 Hypertensive retinopathy, bilateral: Secondary | ICD-10-CM | POA: Diagnosis not present

## 2016-01-16 DIAGNOSIS — I10 Essential (primary) hypertension: Secondary | ICD-10-CM | POA: Diagnosis not present

## 2016-01-16 DIAGNOSIS — E113293 Type 2 diabetes mellitus with mild nonproliferative diabetic retinopathy without macular edema, bilateral: Secondary | ICD-10-CM

## 2016-01-16 DIAGNOSIS — E11319 Type 2 diabetes mellitus with unspecified diabetic retinopathy without macular edema: Secondary | ICD-10-CM

## 2016-01-16 DIAGNOSIS — H43813 Vitreous degeneration, bilateral: Secondary | ICD-10-CM | POA: Diagnosis not present

## 2016-01-16 MED ORDER — RANITIDINE HCL 300 MG PO TABS: 300.0000 mg | ORAL_TABLET | Freq: Every day | ORAL | 4 refills | Status: DC

## 2016-01-19 ENCOUNTER — Ambulatory Visit: Payer: MEDICARE | Admitting: Gastroenterology

## 2016-02-07 DIAGNOSIS — E1165 Type 2 diabetes mellitus with hyperglycemia: Secondary | ICD-10-CM | POA: Diagnosis not present

## 2016-02-07 DIAGNOSIS — E559 Vitamin D deficiency, unspecified: Secondary | ICD-10-CM | POA: Diagnosis not present

## 2016-02-07 DIAGNOSIS — E7800 Pure hypercholesterolemia, unspecified: Secondary | ICD-10-CM | POA: Diagnosis not present

## 2016-02-08 DIAGNOSIS — H31091 Other chorioretinal scars, right eye: Secondary | ICD-10-CM | POA: Diagnosis not present

## 2016-02-08 DIAGNOSIS — H25813 Combined forms of age-related cataract, bilateral: Secondary | ICD-10-CM | POA: Diagnosis not present

## 2016-02-08 DIAGNOSIS — H40013 Open angle with borderline findings, low risk, bilateral: Secondary | ICD-10-CM | POA: Diagnosis not present

## 2016-02-09 DIAGNOSIS — E7800 Pure hypercholesterolemia, unspecified: Secondary | ICD-10-CM | POA: Diagnosis not present

## 2016-02-09 DIAGNOSIS — E559 Vitamin D deficiency, unspecified: Secondary | ICD-10-CM | POA: Diagnosis not present

## 2016-02-09 DIAGNOSIS — E1165 Type 2 diabetes mellitus with hyperglycemia: Secondary | ICD-10-CM | POA: Diagnosis not present

## 2016-02-09 DIAGNOSIS — Z794 Long term (current) use of insulin: Secondary | ICD-10-CM | POA: Diagnosis not present

## 2016-02-09 DIAGNOSIS — E113293 Type 2 diabetes mellitus with mild nonproliferative diabetic retinopathy without macular edema, bilateral: Secondary | ICD-10-CM | POA: Diagnosis not present

## 2016-03-02 DIAGNOSIS — H25813 Combined forms of age-related cataract, bilateral: Secondary | ICD-10-CM | POA: Diagnosis not present

## 2016-03-02 DIAGNOSIS — H31091 Other chorioretinal scars, right eye: Secondary | ICD-10-CM | POA: Diagnosis not present

## 2016-03-02 DIAGNOSIS — H40013 Open angle with borderline findings, low risk, bilateral: Secondary | ICD-10-CM | POA: Diagnosis not present

## 2016-04-13 DIAGNOSIS — H31091 Other chorioretinal scars, right eye: Secondary | ICD-10-CM | POA: Diagnosis not present

## 2016-04-13 DIAGNOSIS — H40013 Open angle with borderline findings, low risk, bilateral: Secondary | ICD-10-CM | POA: Diagnosis not present

## 2016-04-13 DIAGNOSIS — H25813 Combined forms of age-related cataract, bilateral: Secondary | ICD-10-CM | POA: Diagnosis not present

## 2016-05-09 DIAGNOSIS — E559 Vitamin D deficiency, unspecified: Secondary | ICD-10-CM | POA: Diagnosis not present

## 2016-05-09 DIAGNOSIS — E1165 Type 2 diabetes mellitus with hyperglycemia: Secondary | ICD-10-CM | POA: Diagnosis not present

## 2016-05-09 DIAGNOSIS — E7800 Pure hypercholesterolemia, unspecified: Secondary | ICD-10-CM | POA: Diagnosis not present

## 2016-05-11 DIAGNOSIS — Z794 Long term (current) use of insulin: Secondary | ICD-10-CM | POA: Diagnosis not present

## 2016-05-11 DIAGNOSIS — E7800 Pure hypercholesterolemia, unspecified: Secondary | ICD-10-CM | POA: Diagnosis not present

## 2016-05-11 DIAGNOSIS — E1165 Type 2 diabetes mellitus with hyperglycemia: Secondary | ICD-10-CM | POA: Diagnosis not present

## 2016-05-11 DIAGNOSIS — E113293 Type 2 diabetes mellitus with mild nonproliferative diabetic retinopathy without macular edema, bilateral: Secondary | ICD-10-CM | POA: Diagnosis not present

## 2016-05-11 DIAGNOSIS — E559 Vitamin D deficiency, unspecified: Secondary | ICD-10-CM | POA: Diagnosis not present

## 2016-05-11 DIAGNOSIS — Z23 Encounter for immunization: Secondary | ICD-10-CM | POA: Diagnosis not present

## 2016-05-14 DIAGNOSIS — H25813 Combined forms of age-related cataract, bilateral: Secondary | ICD-10-CM | POA: Diagnosis not present

## 2016-05-14 DIAGNOSIS — H31091 Other chorioretinal scars, right eye: Secondary | ICD-10-CM | POA: Diagnosis not present

## 2016-05-14 DIAGNOSIS — H40013 Open angle with borderline findings, low risk, bilateral: Secondary | ICD-10-CM | POA: Diagnosis not present

## 2016-05-15 ENCOUNTER — Other Ambulatory Visit: Payer: Self-pay | Admitting: Gastroenterology

## 2016-07-06 ENCOUNTER — Telehealth: Payer: Self-pay | Admitting: Gastroenterology

## 2016-07-06 NOTE — Telephone Encounter (Signed)
Patient with am nausea for the last few weeks.  She will come in and see Mike Gipmy Esterwood PA on 07/18/16

## 2016-07-08 ENCOUNTER — Emergency Department (HOSPITAL_COMMUNITY): Payer: Medicare HMO

## 2016-07-08 ENCOUNTER — Emergency Department (HOSPITAL_COMMUNITY)
Admission: EM | Admit: 2016-07-08 | Discharge: 2016-07-08 | Disposition: A | Discharge status: Home / Self Care | Payer: Medicare HMO | Attending: Emergency Medicine | Admitting: Emergency Medicine

## 2016-07-08 ENCOUNTER — Encounter (HOSPITAL_COMMUNITY): Payer: Self-pay | Admitting: Emergency Medicine

## 2016-07-08 DIAGNOSIS — E119 Type 2 diabetes mellitus without complications: Secondary | ICD-10-CM | POA: Insufficient documentation

## 2016-07-08 DIAGNOSIS — Z7982 Long term (current) use of aspirin: Secondary | ICD-10-CM | POA: Diagnosis not present

## 2016-07-08 DIAGNOSIS — Z79899 Other long term (current) drug therapy: Secondary | ICD-10-CM | POA: Insufficient documentation

## 2016-07-08 DIAGNOSIS — I10 Essential (primary) hypertension: Secondary | ICD-10-CM | POA: Diagnosis not present

## 2016-07-08 DIAGNOSIS — R109 Unspecified abdominal pain: Secondary | ICD-10-CM | POA: Diagnosis present

## 2016-07-08 DIAGNOSIS — K30 Functional dyspepsia: Secondary | ICD-10-CM | POA: Diagnosis not present

## 2016-07-08 DIAGNOSIS — R142 Eructation: Secondary | ICD-10-CM | POA: Diagnosis not present

## 2016-07-08 LAB — COMPREHENSIVE METABOLIC PANEL
ALBUMIN: 4.2 g/dL (ref 3.5–5.0)
ALT: 22 U/L (ref 14–54)
ANION GAP: 9 (ref 5–15)
AST: 20 U/L (ref 15–41)
Alkaline Phosphatase: 56 U/L (ref 38–126)
BUN: 13 mg/dL (ref 6–20)
CHLORIDE: 103 mmol/L (ref 101–111)
CO2: 27 mmol/L (ref 22–32)
Calcium: 10.4 mg/dL — ABNORMAL HIGH (ref 8.9–10.3)
Creatinine, Ser: 0.69 mg/dL (ref 0.44–1.00)
GFR calc Af Amer: >60 mL/min (ref >60)
GFR calc non Af Amer: >60 mL/min (ref >60)
GLUCOSE: 162 mg/dL — AB (ref 65–99)
POTASSIUM: 3.5 mmol/L (ref 3.5–5.1)
SODIUM: 139 mmol/L (ref 135–145)
Total Bilirubin: 0.5 mg/dL (ref 0.3–1.2)
Total Protein: 7.1 g/dL (ref 6.5–8.1)

## 2016-07-08 LAB — URINALYSIS, ROUTINE W REFLEX MICROSCOPIC
Bilirubin Urine: NEGATIVE
Glucose, UA: NEGATIVE mg/dL
KETONES UR: NEGATIVE mg/dL
Leukocytes, UA: NEGATIVE
Nitrite: NEGATIVE
PH: 8 (ref 5.0–8.0)
Protein, ur: NEGATIVE mg/dL
SQUAMOUS EPITHELIAL / LPF: NONE SEEN
Specific Gravity, Urine: 1.003 — ABNORMAL LOW (ref 1.005–1.030)

## 2016-07-08 LAB — CBC
HEMATOCRIT: 41.6 % (ref 36.0–46.0)
HEMOGLOBIN: 13.2 g/dL (ref 12.0–15.0)
MCH: 27.8 pg (ref 26.0–34.0)
MCHC: 31.7 g/dL (ref 30.0–36.0)
MCV: 87.8 fL (ref 78.0–100.0)
Platelets: 202 10*3/uL (ref 150–400)
RBC: 4.74 MIL/uL (ref 3.87–5.11)
RDW: 14.2 % (ref 11.5–15.5)
WBC: 4.3 10*3/uL (ref 4.0–10.5)

## 2016-07-08 LAB — CBG MONITORING, ED: GLUCOSE-CAPILLARY: 108 mg/dL — AB (ref 65–99)

## 2016-07-08 LAB — TROPONIN I

## 2016-07-08 LAB — LIPASE, BLOOD: LIPASE: 16 U/L (ref 11–51)

## 2016-07-08 MED ORDER — GI COCKTAIL ~~LOC~~
30.0000 mL | Freq: Once | ORAL | Status: AC
  Administered 2016-07-08: 30 mL via ORAL
  Filled 2016-07-08: qty 30

## 2016-07-08 MED ORDER — ONDANSETRON 4 MG PO TBDP
4.0000 mg | ORAL_TABLET | Freq: Once | ORAL | Status: AC
  Administered 2016-07-08: 4 mg via ORAL
  Filled 2016-07-08: qty 1

## 2016-07-08 NOTE — ED Provider Notes (Signed)
[facility]-EMERGENCY DEPT Provider Note   CSN: [id number] Arrival date & time: 07/08/16  0931     History   Chief Complaint Chief Complaint  Patient presents with  . Abdominal Pain  . Shortness of Breath    HPI Olivia Roberts is a 83 y.o. female.  HPI 83 year old female who presents with increased belching and nausea at home over the past 2 days. She has a history of type 2 diabetes, hypertension, and GERD. States that over the past few days she has been having increased "GERD" symptoms of increased belching and nausea. Has been exacerbated by fried foods and after eating. Lying back to sleep also has worsened symptoms. On the way here, she did have a brief episode of shortness of breath which she states was due to anxiety, which is all resolved now. She denies any chest pain. Feels epigastric fullness but does not have abdominal pain. No vomiting, diarrhea or constipation last had a bowel movement in the ED just prior to my evaluation which was normal. No fevers or chills, dysuria, urinary frequency or hematuria.   Past Medical History:  Diagnosis Date  . Allergic rhinitis, cause unspecified   . Chronic pancreatitis (HCC)   . Dyspepsia and other specified disorders of function of stomach   . GERD (gastroesophageal reflux disease)   . Other constipation   . Type II or unspecified type diabetes mellitus with ophthalmic manifestations, not stated as uncontrolled(250.50)   . Type II or unspecified type diabetes mellitus without mention of complication, not stated as uncontrolled   . Unspecified essential hypertension   . Unspecified hemorrhoids without mention of complication     Patient Active Problem List   Diagnosis Date Noted  . GERD (gastroesophageal reflux disease) 10/10/2010  . IBS (irritable bowel syndrome) 10/10/2010  . CONSTIPATION 04/29/2008  . PANCREATITIS, CHRONIC 04/29/2008  . DIABETES MELLITUS, TYPE II, ON INSULIN 04/28/2008  . DIABETIC  RETINOPATHY 04/28/2008  .  HYPERTENSION 04/28/2008  . HEMORRHOIDS 04/28/2008  . ALLERGIC RHINITIS 04/28/2008  . DYSPEPSIA 04/28/2008  . CONSTIPATION, CHRONIC 04/28/2008    Past Surgical History:  Procedure Laterality Date  . ABDOMINAL HYSTERECTOMY    . CESAREAN SECTION     X two    OB History    No data available       Home Medications    Prior to Admission medications   Medication Sig Start Date End Date Taking? Authorizing Provider  aspirin 81 MG tablet Take 81 mg by mouth daily.   Yes Historical Provider, MD  citalopram (CELEXA) 20 MG tablet Take 20 mg by mouth daily. 05/26/15  Yes Historical Provider, MD  docusate sodium (COLACE) 100 MG capsule Take 100 mg by mouth daily.   Yes Historical Provider, MD  ergocalciferol (VITAMIN D2) 50000 UNITS capsule Take 50,000 Units by mouth once a week. Take one by mouth once a week Every Sunday   Yes Historical Provider, MD  hydroxypropyl methylcellulose (ISOPTO TEARS) 2.5 % ophthalmic solution Place 1 drop into both eyes as needed for dry eyes.    Yes Historical Provider, MD  losartan-hydrochlorothiazide (HYZAAR) 100-12.5 MG per tablet Take 1 tablet by mouth daily.    Yes Historical Provider, MD  LUMIGAN 0.01 % SOLN Place 1 drop into both eyes daily. 06/16/16  Yes Historical Provider, MD  NOVOLOG MIX 70/30 FLEXPEN (70-30) 100 UNIT/ML FlexPen Inject 8-10 Units into the skin 2 (two) times daily. Inject 8 units in the morning and 10 units in the evening 05/29/16  Yes  Historical Provider, MD  pioglitazone (ACTOS) 30 MG tablet Take 30 mg by mouth daily.    Yes Historical Provider, MD  potassium chloride SA (K-DUR,KLOR-CON) 20 MEQ tablet Take 20 mEq by mouth daily.    Yes Historical Provider, MD  pravastatin (PRAVACHOL) 80 MG tablet Take 80 mg by mouth at bedtime.   Yes Historical Provider, MD  ranitidine (ZANTAC) 300 MG tablet take 1 tablet by mouth at bedtime 05/15/16  Yes Meryl Dare, MD  amLODipine (NORVASC) 10 MG tablet Take 10 mg by mouth daily. 06/21/16    Historical Provider, MD  cephALEXin (KEFLEX) 500 MG capsule Take 1 capsule (500 mg total) by mouth 3 (three) times daily. Patient not taking: Reported on 07/08/2016 06/28/15   Lyndal Pulley, MD  pantoprazole (PROTONIX) 40 MG tablet Take 1 tablet (40 mg total) by mouth 2 (two) times daily. Patient not taking: Reported on 07/08/2016 07/27/15   Meryl Dare, MD    Family History Family History  Problem Relation Age of Onset  . Heart disease Father   . Colon cancer Sister     and Several Aunts    Social History Social History  Substance Use Topics  . Smoking status: Never Smoker  . Smokeless tobacco: Never Used  . Alcohol use No     Allergies   Penicillins   Review of Systems Review of Systems 10/14 systems reviewed and are negative other than those stated in the HPI   Physical Exam Updated Vital Signs BP 139/61   Pulse 70   Temp 97.7 F (36.5 C) (Oral)   Resp 18   SpO2 99%   Physical Exam Physical Exam  Nursing note and vitals reviewed. Constitutional: Well developed, well nourished, non-toxic, and in no acute distress Head: Normocephalic and atraumatic.  Mouth/Throat: Oropharynx is clear and moist.  Neck: Normal range of motion. Neck supple.  Cardiovascular: Normal rate and regular rhythm.   Pulmonary/Chest: Effort normal and breath sounds normal.  Abdominal: Soft. There is no tenderness. There is no rebound and no guarding.  Musculoskeletal: Normal range of motion.  Neurological: Alert, no facial droop, fluent speech, moves all extremities symmetrically Skin: Skin is warm and dry.  Psychiatric: Cooperative   ED Treatments / Results  Labs (all labs ordered are listed, but only abnormal results are displayed) Labs Reviewed  COMPREHENSIVE METABOLIC PANEL - Abnormal; Notable for the following:       Result Value   Glucose, Bld 162 (*)    Calcium 10.4 (*)    All other components within normal limits  URINALYSIS, ROUTINE W REFLEX MICROSCOPIC - Abnormal; Notable  for the following:    Specific Gravity, Urine 1.003 (*)    Hgb urine dipstick SMALL (*)    Bacteria, UA MANY (*)    All other components within normal limits  CBG MONITORING, ED - Abnormal; Notable for the following:    Glucose-Capillary 108 (*)    All other components within normal limits  LIPASE, BLOOD  CBC  TROPONIN I  TROPONIN I    EKG  EKG Interpretation  Date/Time:  Sunday July 08 2016 09:37:50 EST Ventricular Rate:  82 PR Interval:    QRS Duration: 84 QT Interval:  399 QTC Calculation: 466 R Axis:   49 Text Interpretation:  Sinus rhythm No significant change since last tracing Confirmed by Carnetta Losada MD, Jezreel Justiniano [phone]) on 07/08/2016 11:41:02 AM       Radiology Dg Abdomen Acute W/chest  Result Date: 07/08/2016 CLINICAL DATA:  Nausea and  worsening GERD x 3 days GERD, HTN, DM II, never smoker No sx EXAM: DG ABDOMEN ACUTE W/ 1V CHEST COMPARISON:  CT of 11/29/2009 abdomen pelvis. FINDINGS: Frontal view of the chest demonstrates multiple leads and wires projecting over the chest. Midline trachea. Normal heart size. Atherosclerosis in the transverse aorta. No pleural effusion or pneumothorax. Biapical pleural thickening. Clear lungs. Abdominal films demonstrate no free intraperitoneal air or air-fluid levels on upright positioning. No gaseous distention of bowel loops on supine imaging. Multiple phleboliths in the pelvis. No abnormal abdominal calcifications. No appendicolith. IMPRESSION: No acute findings. Aortic atherosclerosis. Electronically Signed   By: Jeronimo GreavesKyle  Talbot M.D.   On: 07/08/2016 13:07    Procedures Procedures (including critical care time)  Medications Ordered in ED Medications  gi cocktail (Maalox,Lidocaine,Donnatal) (30 mLs Oral Given 07/08/16 1228)  ondansetron (ZOFRAN-ODT) disintegrating tablet 4 mg (4 mg Oral Given 07/08/16 1228)     Initial Impression / Assessment and Plan / ED Course  I have reviewed the triage vital signs and the nursing notes.  Pertinent  labs & imaging results that were available during my care of the patient were reviewed by me and considered in my medical decision making (see chart for details).  Clinical Course     83 year old female who presents with indigestion symptoms, which she states are consistent with her GERD symptoms. She is nontoxic in no acute distress. Vital signs are stable. She has a soft and benign abdomen. Cardiopulmonary exam is unremarkable. She received a GI cocktail, and has full resolution of her symptoms.  Given her age and history of diabetes to perform serial EKGs and serial troponins. No evidence of ischemic changes or concerns for a ACS at this time.  Abdomen benign, and unlikely to be obstruction or other serious intra-abdominal process. X-rays visualized and does not show evidence of free air or obstructive bowel process. Blood work reviewed and overall unremarkable CBC, comprehensive metabolic panel, lipase and urinalysis.  Felt to be stable for discharge home. Strict return and follow-up instructions reviewed. She and family expressed understanding of all discharge instructions and felt comfortable with the plan of care.   Final Clinical Impressions(s) / ED Diagnoses   Final diagnoses:  Indigestion  Belching    New Prescriptions New Prescriptions   No medications on file     Lavera Guiseana Duo Madysin Crisp, MD 07/08/16 [phone]

## 2016-07-08 NOTE — ED Notes (Signed)
Troponin collected in triage

## 2016-07-08 NOTE — Discharge Instructions (Signed)
Follow-up with your primary care doctor regarding your protonix and zantac. If you are having further reflux symptoms, you can also try tums and maaloxx as needed.  Return without fail for worsening symptoms, including fever, confusion, chest pain, abdominal pain, difficulty breathing or any other symptoms concerning to you.

## 2016-07-08 NOTE — ED Notes (Signed)
Dr. Verdie MosherLiu is with her as I write this. Pt. Tells me she has had "trouble with my GERD for two days". She is in no distress and is breathing normally. Her skin is normal, warm and dry.

## 2016-07-08 NOTE — ED Notes (Signed)
MD at bedside. 

## 2016-07-08 NOTE — ED Triage Notes (Signed)
Pt c/o belching, epigastric pain, SOB, nausea onset 0300 today. No emesis, diarrhea.

## 2016-07-12 ENCOUNTER — Other Ambulatory Visit: Payer: Self-pay | Admitting: Gastroenterology

## 2016-07-18 ENCOUNTER — Ambulatory Visit: Payer: MEDICARE | Admitting: Physician Assistant

## 2016-08-15 ENCOUNTER — Other Ambulatory Visit: Payer: Self-pay | Admitting: Gastroenterology

## 2016-09-10 ENCOUNTER — Other Ambulatory Visit: Payer: Self-pay | Admitting: Gastroenterology

## 2016-10-09 DIAGNOSIS — E7800 Pure hypercholesterolemia, unspecified: Secondary | ICD-10-CM | POA: Insufficient documentation

## 2016-10-09 DIAGNOSIS — E113293 Type 2 diabetes mellitus with mild nonproliferative diabetic retinopathy without macular edema, bilateral: Secondary | ICD-10-CM | POA: Insufficient documentation

## 2016-10-11 ENCOUNTER — Other Ambulatory Visit: Payer: Self-pay | Admitting: Gastroenterology

## 2016-11-09 ENCOUNTER — Other Ambulatory Visit: Payer: Self-pay | Admitting: Gastroenterology

## 2016-12-09 ENCOUNTER — Other Ambulatory Visit: Payer: Self-pay | Admitting: Gastroenterology

## 2017-01-08 ENCOUNTER — Other Ambulatory Visit: Payer: Self-pay | Admitting: Gastroenterology

## 2017-01-16 ENCOUNTER — Ambulatory Visit (INDEPENDENT_AMBULATORY_CARE_PROVIDER_SITE_OTHER): Payer: Medicare HMO | Admitting: Ophthalmology

## 2017-01-16 DIAGNOSIS — E10311 Type 1 diabetes mellitus with unspecified diabetic retinopathy with macular edema: Secondary | ICD-10-CM | POA: Diagnosis not present

## 2017-01-16 DIAGNOSIS — H43813 Vitreous degeneration, bilateral: Secondary | ICD-10-CM

## 2017-01-16 DIAGNOSIS — E113392 Type 2 diabetes mellitus with moderate nonproliferative diabetic retinopathy without macular edema, left eye: Secondary | ICD-10-CM

## 2017-01-16 DIAGNOSIS — I10 Essential (primary) hypertension: Secondary | ICD-10-CM

## 2017-01-16 DIAGNOSIS — E113291 Type 2 diabetes mellitus with mild nonproliferative diabetic retinopathy without macular edema, right eye: Secondary | ICD-10-CM | POA: Diagnosis not present

## 2017-01-16 DIAGNOSIS — H35033 Hypertensive retinopathy, bilateral: Secondary | ICD-10-CM

## 2017-02-07 ENCOUNTER — Other Ambulatory Visit: Payer: Self-pay | Admitting: Gastroenterology

## 2017-03-09 ENCOUNTER — Other Ambulatory Visit: Payer: Self-pay | Admitting: Gastroenterology

## 2017-04-08 ENCOUNTER — Other Ambulatory Visit: Payer: Self-pay | Admitting: Gastroenterology

## 2018-01-30 ENCOUNTER — Encounter (INDEPENDENT_AMBULATORY_CARE_PROVIDER_SITE_OTHER): Payer: Medicare HMO | Admitting: Ophthalmology

## 2018-01-30 DIAGNOSIS — I10 Essential (primary) hypertension: Secondary | ICD-10-CM

## 2018-01-30 DIAGNOSIS — E11319 Type 2 diabetes mellitus with unspecified diabetic retinopathy without macular edema: Secondary | ICD-10-CM | POA: Diagnosis not present

## 2018-01-30 DIAGNOSIS — E113291 Type 2 diabetes mellitus with mild nonproliferative diabetic retinopathy without macular edema, right eye: Secondary | ICD-10-CM | POA: Diagnosis not present

## 2018-01-30 DIAGNOSIS — E113392 Type 2 diabetes mellitus with moderate nonproliferative diabetic retinopathy without macular edema, left eye: Secondary | ICD-10-CM

## 2018-01-30 DIAGNOSIS — H43813 Vitreous degeneration, bilateral: Secondary | ICD-10-CM

## 2018-01-30 DIAGNOSIS — H35033 Hypertensive retinopathy, bilateral: Secondary | ICD-10-CM

## 2019-02-05 ENCOUNTER — Other Ambulatory Visit: Payer: Self-pay

## 2019-02-05 ENCOUNTER — Encounter (INDEPENDENT_AMBULATORY_CARE_PROVIDER_SITE_OTHER): Payer: Medicare HMO | Admitting: Ophthalmology

## 2019-02-05 DIAGNOSIS — E113292 Type 2 diabetes mellitus with mild nonproliferative diabetic retinopathy without macular edema, left eye: Secondary | ICD-10-CM

## 2019-02-05 DIAGNOSIS — I10 Essential (primary) hypertension: Secondary | ICD-10-CM | POA: Diagnosis not present

## 2019-02-05 DIAGNOSIS — E113211 Type 2 diabetes mellitus with mild nonproliferative diabetic retinopathy with macular edema, right eye: Secondary | ICD-10-CM | POA: Diagnosis not present

## 2019-02-05 DIAGNOSIS — E11319 Type 2 diabetes mellitus with unspecified diabetic retinopathy without macular edema: Secondary | ICD-10-CM

## 2019-02-05 DIAGNOSIS — H43813 Vitreous degeneration, bilateral: Secondary | ICD-10-CM

## 2019-02-05 DIAGNOSIS — H35033 Hypertensive retinopathy, bilateral: Secondary | ICD-10-CM

## 2020-02-11 ENCOUNTER — Encounter (INDEPENDENT_AMBULATORY_CARE_PROVIDER_SITE_OTHER): Payer: Medicare HMO | Admitting: Ophthalmology

## 2020-02-24 ENCOUNTER — Ambulatory Visit: Payer: MEDICARE | Admitting: Neurology

## 2020-03-04 DIAGNOSIS — F419 Anxiety disorder, unspecified: Secondary | ICD-10-CM | POA: Insufficient documentation

## 2020-08-04 ENCOUNTER — Ambulatory Visit: Payer: MEDICARE | Admitting: Neurology

## 2020-10-12 ENCOUNTER — Ambulatory Visit: Payer: MEDICARE | Admitting: Neurology

## 2020-12-20 ENCOUNTER — Encounter: Payer: Self-pay | Admitting: Family Medicine

## 2020-12-20 ENCOUNTER — Other Ambulatory Visit: Payer: Self-pay

## 2020-12-20 ENCOUNTER — Ambulatory Visit (INDEPENDENT_AMBULATORY_CARE_PROVIDER_SITE_OTHER): Payer: Medicare HMO | Admitting: Family Medicine

## 2020-12-20 VITALS — BP 128/70 | HR 71 | Temp 97.3°F | Ht 59.5 in | Wt 168.8 lb

## 2020-12-20 DIAGNOSIS — R4189 Other symptoms and signs involving cognitive functions and awareness: Secondary | ICD-10-CM

## 2020-12-20 DIAGNOSIS — E1169 Type 2 diabetes mellitus with other specified complication: Secondary | ICD-10-CM | POA: Diagnosis not present

## 2020-12-20 DIAGNOSIS — I152 Hypertension secondary to endocrine disorders: Secondary | ICD-10-CM

## 2020-12-20 DIAGNOSIS — E669 Obesity, unspecified: Secondary | ICD-10-CM

## 2020-12-20 DIAGNOSIS — E1159 Type 2 diabetes mellitus with other circulatory complications: Secondary | ICD-10-CM

## 2020-12-20 NOTE — Patient Instructions (Signed)
Continue same medicines as before Continue to watch your diet keeping carbohydrates and sweets and diet sodas and other diet foods to a minimum.

## 2020-12-20 NOTE — Progress Notes (Signed)
Provider:  Jacalyn Lefevre, MD  Careteam: Patient Care Team: Frederica Kuster, MD as PCP - General (Family Medicine)  PLACE OF SERVICE:  [facility]  Advanced Directive information      Chief Complaint  Patient presents with   New Patient (Initial Visit)    Patient presents today for new patient appointment. She has a history/ diagnosis of hypertension, diabetes and GERD.     HPI: Patient is a 87 y.o. female .  New patient, formerly seen by Dr. Kyra Searles.  She has diabetes hypertension.  Her most recent A1c's have been in the 10 range.  In talking to patient and her daughter.  Her diet is not optimal.  She eats lots of snacks and "sugar-free" foods.  She lives in a house with her grandson and son but they do not help her adhere to a diet low in carbs and sweets.  She takes NovoLog Mix 70/30 twice daily as well as Actos 30 mg a day.  She also takes losartan for blood pressure, citalopram for anxiety depression, baby aspirin, and drops for glaucoma. She denies problems with balance or falls.  She denies symptoms except for glaucoma.  She denies pain in her feet or hands suggestive of neuropathy. She seems to function fairly well but her cognitive abilities are in decline.  Review of Systems:  Review of Systems  Eyes: Negative.   Respiratory: Negative.    Cardiovascular: Negative.   Gastrointestinal:  Positive for heartburn.  Neurological: Negative.   Psychiatric/Behavioral:  Positive for memory loss.   All other systems reviewed and are negative.  Past Medical History:  Diagnosis Date   Allergic rhinitis, cause unspecified    Chronic pancreatitis (HCC)    Dyspepsia and other specified disorders of function of stomach    GERD (gastroesophageal reflux disease)    Other constipation    Type II or unspecified type diabetes mellitus with ophthalmic manifestations, not stated as uncontrolled(250.50)    Type II or unspecified type diabetes mellitus without mention of  complication, not stated as uncontrolled    Unspecified essential hypertension    Unspecified hemorrhoids without mention of complication    Past Surgical History:  Procedure Laterality Date   ABDOMINAL HYSTERECTOMY     CESAREAN SECTION     X two   Social History:   reports that she has never smoked. She has never used smokeless tobacco. She reports that she does not drink alcohol and does not use drugs.  Family History  Problem Relation Age of Onset   Hypertension Father    Colon cancer Sister        and Several Aunts   Diabetes Son     Medications: Patient's Medications  New Prescriptions   No medications on file  Previous Medications   ASPIRIN 81 MG TABLET    Take 81 mg by mouth daily.   CITALOPRAM (CELEXA) 20 MG TABLET    Take 20 mg by mouth daily.   DOCUSATE SODIUM (COLACE) 100 MG CAPSULE    Take 100 mg by mouth daily.   HYDROXYPROPYL METHYLCELLULOSE (ISOPTO TEARS) 2.5 % OPHTHALMIC SOLUTION    Place 1 drop into both eyes as needed for dry eyes.    LOSARTAN-HYDROCHLOROTHIAZIDE (HYZAAR) 100-12.5 MG PER TABLET    Take 1 tablet by mouth daily.    LUMIGAN 0.01 % SOLN    Place 1 drop into both eyes daily.   NOVOLOG MIX 70/30 FLEXPEN (70-30) 100 UNIT/ML FLEXPEN    Inject 8-10  Units into the skin 2 (two) times daily. Inject 8 units in the morning and 10 units in the evening   PIOGLITAZONE (ACTOS) 30 MG TABLET    Take 30 mg by mouth daily.    RANITIDINE (ZANTAC) 300 MG TABLET    take 1 tablet by mouth at bedtime (APPOINTMENT REQUIRED FOR REFILLS)  Modified Medications   No medications on file  Discontinued Medications   AMLODIPINE (NORVASC) 10 MG TABLET    Take 10 mg by mouth daily.   POTASSIUM CHLORIDE SA (K-DUR,KLOR-CON) 20 MEQ TABLET    Take 20 mEq by mouth daily.   PRAVASTATIN (PRAVACHOL) 80 MG TABLET    Take 80 mg by mouth at bedtime.    Physical Exam:  Vitals:   12/20/20 1322  BP: 128/70  Pulse: 71  Temp: (!) 97.3 F (36.3 C)  TempSrc: Temporal  SpO2: 98%   Weight: 168 lb 12.8 oz (76.6 kg)  Height: 4' 11.5" (1.511 m)   Body mass index is 33.52 kg/m. Wt Readings from Last 3 Encounters:  12/20/20 168 lb 12.8 oz (76.6 kg)  07/27/15 163 lb (73.9 kg)  03/24/14 145 lb (65.8 kg)    Physical Exam Vitals and nursing note reviewed.  Constitutional:      Appearance: Normal appearance. She is obese.  HENT:     Right Ear: Tympanic membrane normal.     Left Ear: Tympanic membrane normal.  Eyes:     Pupils: Pupils are equal, round, and reactive to light.  Cardiovascular:     Rate and Rhythm: Normal rate and regular rhythm.  Pulmonary:     Effort: Pulmonary effort is normal.     Breath sounds: Normal breath sounds.  Abdominal:     General: Abdomen is flat. Bowel sounds are normal.  Musculoskeletal:     Cervical back: Normal range of motion.  Neurological:     General: No focal deficit present.     Mental Status: She is alert and oriented to person, place, and time.  Psychiatric:        Mood and Affect: Mood normal.        Behavior: Behavior normal.    Labs reviewed: Basic Metabolic Panel: No results for input(s): NA, K, CL, CO2, GLUCOSE, BUN, CREATININE, CALCIUM, MG, PHOS, TSH in the last 8760 hours. Liver Function Tests: No results for input(s): AST, ALT, ALKPHOS, BILITOT, PROT, ALBUMIN in the last 8760 hours. No results for input(s): LIPASE, AMYLASE in the last 8760 hours. No results for input(s): AMMONIA in the last 8760 hours. CBC: No results for input(s): WBC, NEUTROABS, HGB, HCT, MCV, PLT in the last 8760 hours. Lipid Panel: No results for input(s): CHOL, HDL, LDLCALC, TRIG, CHOLHDL, LDLDIRECT in the last 8760 hours. TSH: No results for input(s): TSH in the last 8760 hours. A1C: No results found for: HGBA1C   Assessment/Plan  1. Diabetes mellitus type 2 in obese (HCC) Most recent A1c elevated at 10.4.  She is on insulin twice a day as well as Actos.  I think if we could get her to cut down on some carbohydrates and  sweets in diet drinks, A1c would be much improved  2. Hypertension associated with diabetes (HCC) Blood pressure today is excellent 128/70 on losartan  3. Cognitive decline Patient lives with son and grandson.  I do not think she could function independently.  Brief review of her cognitive abilities today include not knowing day or date Not recalling any of 3 words at 5 minutes She was  able to draw a semblance of a clock but could not understand how to put minute in our hands on the clock She did recall what she ate for supper last night.   Jacalyn Lefevre, MD [facility] [phone]

## 2021-01-02 ENCOUNTER — Encounter: Payer: Self-pay | Admitting: Neurology

## 2021-01-02 ENCOUNTER — Ambulatory Visit (INDEPENDENT_AMBULATORY_CARE_PROVIDER_SITE_OTHER): Payer: Medicare HMO | Admitting: Neurology

## 2021-01-02 VITALS — BP 171/80 | HR 78 | Ht 59.0 in | Wt 171.0 lb

## 2021-01-02 DIAGNOSIS — R4189 Other symptoms and signs involving cognitive functions and awareness: Secondary | ICD-10-CM

## 2021-01-02 DIAGNOSIS — G301 Alzheimer's disease with late onset: Secondary | ICD-10-CM | POA: Insufficient documentation

## 2021-01-02 DIAGNOSIS — F0280 Dementia in other diseases classified elsewhere without behavioral disturbance: Secondary | ICD-10-CM

## 2021-01-02 DIAGNOSIS — R292 Abnormal reflex: Secondary | ICD-10-CM | POA: Insufficient documentation

## 2021-01-02 DIAGNOSIS — E1142 Type 2 diabetes mellitus with diabetic polyneuropathy: Secondary | ICD-10-CM

## 2021-01-02 MED ORDER — DONEPEZIL HCL 5 MG PO TBDP: 5.0000 mg | ORAL_TABLET | Freq: Every day | ORAL | 5 refills | Status: DC

## 2021-01-02 NOTE — Progress Notes (Signed)
Provider:  Melvyn Novas, M D  Referring Provider: Hali Marry, MD Primary Care Physician:  Frederica Kuster, MD  Chief Complaint  Patient presents with   New Patient (Initial Visit)    Pt with daughter, rm 20. Presents today with ongoing memory concerns. They started noticing about a yr ago but more recently has started showing signs of declining. She lives with her nephew and daughter checks in daily. She is good about remembering to take her medications but get mixed up on dates. Hard of hearing.     HPI:  Olivia Roberts is a 87 y.o. female and seen upon a referral  from Dr. Leslie Dales for memory loss.   Pt her with daughter, rm 26. Presents today with ongoing memory concerns. started noticing memory about a year ago but more recently has started declining.  She can't remember dates, and doesn't take notes, missed appointments.  She no longer handles her own cheque-book. She recently changed primary care physicians, now at [facility]-  She lives with her nephews and her daughter checks in daily. She is good about remembering to take her medications but has hearing loss. No hearing aid. She lives with 2 nephews. No longer driving after getting lost 3 months ago , during a day with light and visibility.   She gets up at 7.30 AM by herself and eats breakfast, cereal.  She gets up and takes her meds, get dressed, and walks to mail box. By lunchtime 12 noon she is taking diabetic medication, she cooks for herself. Her nephews come home form work- and she will cook them dinner around 5-7 Pm.  Her daughter stops by after her work, usually at 5 PM.  Her son coaches at [employer], football team,  and he comes by at 8-9 PM and gets her to bed.     The patient is retired from Pharmacist, hospital,  widowed,  adult children. Grandchildren.  She was one child of 4 , grew up in [place]  , picking cotton. Mother died at age 105 in childbirth with her 5th  child. Graduated High School. 12 th grade.,  Lifelong non drinker, non smoker, no caffeine.       Review of Systems: Out of a complete 14 system review, the patient complains of only the following symptoms, and all other reviewed systems are negative.  Memory loss.    Social History   Socioeconomic History   Marital status: Widowed    Spouse name: Not on file   Number of children: 3   Years of education: Not on file   Highest education level: Not on file  Occupational History   Occupation: retired  Tobacco Use   Smoking status: Never   Smokeless tobacco: Never  Vaping Use   Vaping Use: Never used  Substance and Sexual Activity   Alcohol use: Never   Drug use: Never   Sexual activity: Not on file  Other Topics Concern   Not on file  Social History Narrative   No caffeine   Social Determinants of Health   Financial Resource Strain: Not on file  Food Insecurity: Not on file  Transportation Needs: Not on file  Physical Activity: Not on file  Stress: Not on file  Social Connections: Not on file  Intimate Partner Violence: Not on file    Family History  Problem Relation Age of Onset   Hypertension Father    Colon cancer Sister  and Several Aunts   Diabetes Son     Past Medical History:  Diagnosis Date   Allergic rhinitis, cause unspecified    Chronic pancreatitis (HCC)    Dyspepsia and other specified disorders of function of stomach    GERD (gastroesophageal reflux disease)    Other constipation    Type II or unspecified type diabetes mellitus with ophthalmic manifestations, not stated as uncontrolled(250.50)    Type II or unspecified type diabetes mellitus without mention of complication, not stated as uncontrolled    Unspecified essential hypertension    Unspecified hemorrhoids without mention of complication     Past Surgical History:  Procedure Laterality Date   ABDOMINAL HYSTERECTOMY     CESAREAN SECTION     X two    Current Outpatient  Medications  Medication Sig Dispense Refill   aspirin 81 MG tablet Take 81 mg by mouth daily.     citalopram (CELEXA) 20 MG tablet Take 20 mg by mouth daily.  1   docusate sodium (COLACE) 100 MG capsule Take 100 mg by mouth daily.     hydroxypropyl methylcellulose (ISOPTO TEARS) 2.5 % ophthalmic solution Place 1 drop into both eyes as needed for dry eyes.      losartan-hydrochlorothiazide (HYZAAR) 100-12.5 MG per tablet Take 1 tablet by mouth daily.      LUMIGAN 0.01 % SOLN Place 1 drop into both eyes daily.  0   NOVOLOG MIX 70/30 FLEXPEN (70-30) 100 UNIT/ML FlexPen Inject 8-10 Units into the skin 2 (two) times daily. Inject 8 units in the morning and 10 units in the evening  1   pioglitazone (ACTOS) 30 MG tablet Take 30 mg by mouth daily.      ranitidine (ZANTAC) 300 MG tablet take 1 tablet by mouth at bedtime (APPOINTMENT REQUIRED FOR REFILLS) 30 tablet 0   No current facility-administered medications for this visit.    Allergies as of 01/02/2021 - Review Complete 01/02/2021  Allergen Reaction Noted   Penicillins Other (See Comments) 06/28/2015    Vitals: BP (!) 171/80   Pulse 78   Ht 4\' 11"  (1.499 m)   Wt 171 lb (77.6 kg)   BMI 34.54 kg/m  Last Weight:  Wt Readings from Last 1 Encounters:  01/02/21 171 lb (77.6 kg)   Last Height:   Ht Readings from Last 1 Encounters:  01/02/21 4\' 11"  (1.499 m)    Physical exam:  General: The patient is awake, alert and appears not in acute distress. The patient is well groomed. Head: Normocephalic, atraumatic. Neck is supple. Mallampati 3, neck circumference: 15 Cardiovascular:  Regular rate and rhythm, without  murmurs or carotid bruit, and without distended neck veins. Respiratory: Lungs are clear to auscultation. Skin:  Without evidence of edema, or rash Trunk: BMI is 34 elevated and patient  has normal posture.  Neurologic exam : The patient is awake and alert, oriented to place and time.  Memory subjective  described as intact.  mini-mental status exam:   Graduated High School. 12 th grade.,  MMSE - Mini Mental State Exam 01/02/2021  Orientation to time 1  Orientation to Place 3  Registration 3  Attention/ Calculation 0  Recall 0  Language- name 2 objects 2  Language- repeat 0  Language- follow 3 step command 3  Language- read & follow direction 1  Write a sentence 1  Copy design 0  Total score 14     There is a normal attention span & concentration ability. Speech is fluent  without  dysarthria, dysphonia or aphasia. Mood and affect are appropriate.  Cranial nerves: Pupils are equal and briskly reactive to light. Funduscopic exam; no edema. Extraocular movements  in vertical and horizontal planes intact , but central vision seems impaired- Hearing to finger rub impaired. .   Facial sensation intact to fine touch. Facial motor strength is symmetric and tongue and uvula move midline.  Tongue protrusion into either cheek is normal. Shoulder shrug is normal.   Motor exam:   Normal tone ,muscle bulk and symmetric strength in all extremities.  Sensory:  neuropathy- Fine touch and vibration were tested in all extremities. Severely decreased in both feet, ankles.  Proprioception was normal.  Coordination: Rapid alternating movements in the fingers/hands were normal. Finger-to-nose maneuver normal without evidence of ataxia, dysmetria or tremor.  Gait and station: Patient walks without assistive device and is able unassisted to climb up to the exam table. Strength within normal limits.  Stance is stable and normal. Tandem gait is unfragmented. Romberg testing deferred.   Deep tendon reflexes: in the  upper and lower extremities are symmetric and intact.  They are actually brisk, hyperreflexia.    Assessment:  After physical and neurologic examination, review of laboratory studies, imaging, neurophysiology testing and pre-existing records,  60 minute assessment is that of :  Patient with gradually decreasing  cognitive abilities, now at a moderate stage of dementia. Unable to understand a clock face, reading, trouble with math.  The additional finding of brisk reflexes must be evaluated by MRI brain- does she have vascular dementia, yet a non-step wise decline speaks against that.  Hearing and vision impairment. These must be corrected  to allow better memory, too.   Plan:  Treatment plan and additional workup :  Brain MRI- non contrast. Audiology evaluation.  Bring your reading glasses.  Aricept starting at 5 mg po, and with next visit increasing to 10 mg po.  Dementia panel ordered. Rv with Np in 3-4 month,  daughter to be called with appointments.    Porfirio Mylar Galen Malkowski MD 01/02/2021

## 2021-01-02 NOTE — Patient Instructions (Signed)
Dementia Dementia is a condition that affects the way the brain functions. It often affects memory and thinking. Usually, dementia gets worse with time and cannot be reversed (progressive dementia). There are many types of dementia, including: Alzheimer's disease. This type is the most common. Vascular dementia. This type may happen as the result of a stroke. Lewy body dementia. This type may happen to people who have Parkinson's disease. Frontotemporal dementia. This type is caused by damage to nerve cells (neurons) in certain parts of the brain. Some people may be affected by more than one type of dementia. This is calledmixed dementia. What are the causes? Dementia is caused by damage to cells in the brain. The area of the brain and the types of cells damaged determine the type of dementia. Usually, this damage is irreversible or cannot be undone. Some examples of irreversible causes include: Conditions that affect the blood vessels of the brain, such as diabetes, heart disease, or blood vessel disease. Genetic mutations. In some cases, changes in the brain may be caused by another condition and can be reversed or slowed. Some examples of reversible causes include: Injury to the brain. Certain medicines. Infection, such as meningitis. Metabolic problems, such as vitamin B12 deficiency or thyroid disease. Pressure on the brain, such as from a tumor, blood clot, or too much fluid in the brain (hydrocephalus). Autoimmune diseases that affect the brain or arteries, such as limbic encephalitis or vasculitis. What are the signs or symptoms? Symptoms of dementia depend on the type of dementia. Common signs of dementia include problems with remembering, thinking, problem solving, decision making, and communicating. These signs develop slowly or get worse with time. This may include: Problems remembering events or people. Having trouble taking a bath or putting clothes on. Forgetting appointments or  forgetting to pay bills. Difficulty planning and preparing meals. Having trouble speaking. Getting lost easily. Changes in behavior or mood. How is this diagnosed? This condition is diagnosed by a specialist (neurologist). It is diagnosed based on the history of your symptoms, your medical history, a physical exam, and tests. Tests may include: Tests to evaluate brain function, such as memory tests, cognitive tests, and other tests. Lab tests, such as blood or urine tests. Imaging tests, such as a CT scan, a PET scan, or an MRI. Genetic testing. This may be done if other family members have a diagnosis of certain types of dementia. Your health care provider will talk with you and your family, friends, orcaregivers about your history and symptoms. How is this treated? Treatment for this condition depends on the cause of the dementia. Progressive dementias, such as Alzheimer's disease, cannot be cured, but there may betreatments that help to manage symptoms. Treatment might involve taking medicines that may help to: Control the dementia. Slow down the progression of the dementia. Manage symptoms. In some cases, treating the cause of your dementia can improve symptoms,reverse symptoms, or slow down how quickly your dementia becomes worse. Your health care provider can direct you to support groups, organizations, andother health care providers who can help with decisions about your care. Follow these instructions at home: Medicines Take over-the-counter and prescription medicines only as told by your health care provider. Use a pill organizer or pill reminder to help you manage your medicines. Avoid taking medicines that can affect thinking, such as pain medicines or sleeping medicines. Lifestyle Make healthy lifestyle choices. Be physically active as told by your health care provider. Do not use any products that contain nicotine or   tobacco, such as cigarettes, e-cigarettes, and chewing  tobacco. If you need help quitting, ask your health care provider. Do not drink alcohol. Practice stress-management techniques when you get stressed. Spend time with other people. Make sure to get quality sleep. These tips can help you get a good night's rest: Avoid napping during the day. Keep your sleeping area dark and cool. Avoid exercising during the few hours before you go to bed. Avoid caffeine products in the evening. Eating and drinking Drink enough fluid to keep your urine pale yellow. Eat a healthy diet. General instructions  Work with your health care provider to determine what you need help with and what your safety needs are. Talk with your health care provider about whether it is safe for you to drive. If you were given a bracelet that identifies you as a person with memory loss or tracks your location, make sure to wear it at all times. Work with your family to make important decisions, such as advance directives, medical power of attorney, or a living will. Keep all follow-up visits. This is important.  Where to find more information Alzheimer's Association: www.alz.org National Institute on Aging: www.nia.nih.gov/alzheimers World Health Organization: www.who.int Contact a health care provider if: You have any new or worsening symptoms. You have problems with choking or swallowing. Get help right away if: You feel depressed or sad, or feel that you want to harm yourself. Your family members become concerned for your safety. If you ever feel like you may hurt yourself or others, or have thoughts about taking your own life, get help right away. Go to your nearest emergency department or: Call your local emergency services (911 in the [place] Call a suicide crisis helpline, such as the National Suicide Prevention Lifeline at 1-800-273-8255. This is open 24 hours a day in the [place] Text the Crisis Text Line at 741741 (in the [place] Summary Dementia is a condition that  affects the way the brain functions. Dementia often affects memory and thinking. Usually, dementia gets worse with time and cannot be reversed (progressive dementia). Treatment for this condition depends on the cause of the dementia. Work with your health care provider to determine what you need help with and what your safety needs are. Your health care provider can direct you to support groups, organizations, and other health care providers who can help with decisions about your care. This information is not intended to replace advice given to you by your health care provider. Make sure you discuss any questions you have with your healthcare provider. Document Revised: 10/26/2019 Document Reviewed: 10/26/2019 Elsevier Patient Education  2022 Elsevier Inc.  

## 2021-01-03 LAB — DEMENTIA PANEL
Homocysteine: 7.3 umol/L (ref 0.0–21.3)
RPR Ser Ql: NONREACTIVE
TSH: 3.87 u[IU]/mL (ref 0.450–4.500)
Vitamin B-12: 649 pg/mL (ref 232–1245)

## 2021-01-04 ENCOUNTER — Telehealth: Payer: Self-pay | Admitting: Neurology

## 2021-01-04 ENCOUNTER — Other Ambulatory Visit: Payer: Self-pay | Admitting: Neurology

## 2021-01-04 MED ORDER — DONEPEZIL HCL 10 MG PO TABS: 5.0000 mg | ORAL_TABLET | Freq: Every day | ORAL | 0 refills | Status: DC

## 2021-01-04 NOTE — Progress Notes (Signed)
Normal B 12 level TSH and homocysteine.

## 2021-01-04 NOTE — Telephone Encounter (Signed)
Called the patient's daughter and reviewed the lab results with her. Advised of the normal findings. Pt's daughter verbalized understanding.

## 2021-01-04 NOTE — Telephone Encounter (Signed)
Called the daughter back and advised that the medication was re entered and sent to the pharmacy. Instructed them to make sure that she cuts the tablet in half at bedtime to equal 5 mg at bedtime. Informed we will discuss increasing the dose at the next apt. Pt's daughter verbalized understanding.

## 2021-01-04 NOTE — Telephone Encounter (Signed)
-----   Message from Melvyn Novas, MD sent at 01/04/2021  9:37 AM EDT ----- Normal B 12 level TSH and homocysteine.

## 2021-01-04 NOTE — Telephone Encounter (Signed)
Pt's daughter(not on DPR) states [facility] does not have the medication Dr Vickey Huger called in for pt on the 11th, daughter is asking if it can be sent to another pharmacy or would Dr Vickey Huger just have something else called in for pt.

## 2021-01-09 ENCOUNTER — Telehealth: Payer: Self-pay | Admitting: Neurology

## 2021-01-09 NOTE — Telephone Encounter (Signed)
Pt's son called requesting a call back in regards to his mother. Pt's son has some questions about his mothers sugar levels, says she hasn't been taking her medication due to nobody being at home with her at night. Son is concerned, he is not sure if these are common side affects. Please advise.

## 2021-01-09 NOTE — Telephone Encounter (Signed)
Called the son back and advised that these concerns would need to be addressed through PCP office. Advised the pcp is the one to discuss the concerns and next steps.

## 2021-01-10 ENCOUNTER — Encounter: Payer: Self-pay | Admitting: Family Medicine

## 2021-01-10 ENCOUNTER — Ambulatory Visit (INDEPENDENT_AMBULATORY_CARE_PROVIDER_SITE_OTHER): Payer: Medicare HMO | Admitting: Family Medicine

## 2021-01-10 ENCOUNTER — Other Ambulatory Visit: Payer: Self-pay

## 2021-01-10 VITALS — BP 138/68 | HR 83 | Temp 97.8°F

## 2021-01-10 DIAGNOSIS — E1142 Type 2 diabetes mellitus with diabetic polyneuropathy: Secondary | ICD-10-CM | POA: Diagnosis not present

## 2021-01-10 DIAGNOSIS — R4189 Other symptoms and signs involving cognitive functions and awareness: Secondary | ICD-10-CM

## 2021-01-10 NOTE — Progress Notes (Signed)
Provider:  Jacalyn Lefevre, MD  Careteam: Patient Care Team: Frederica Kuster, MD as PCP - General (Family Medicine)  PLACE OF SERVICE:  [facility]  Advanced Directive information      No chief complaint on file.    HPI: Patient is a 87 y.o. female .  Patient lives with son and grandson.  There is concern today about her use of insulin.  She gives her self the insulin and checks her sugar but the result is also great concern regarding her cognitive abilities.  She has been seen recently by neurologist who have diagnosed dementia and started her on Aricept.  She has had several blood sugars below 60 recently testing.  She has been giving herself 26 units of 70/30 insulin as well is taking Actos.  It is probably not appropriate for her to continue administering this insulin herself but we have no alternatives at this time.  Review of Systems:  Review of Systems  Psychiatric/Behavioral:  Positive for memory loss.   All other systems reviewed and are negative.  Past Medical History:  Diagnosis Date   Allergic rhinitis, cause unspecified    Chronic pancreatitis (HCC)    Dyspepsia and other specified disorders of function of stomach    GERD (gastroesophageal reflux disease)    Other constipation    Type II or unspecified type diabetes mellitus with ophthalmic manifestations, not stated as uncontrolled(250.50)    Type II or unspecified type diabetes mellitus without mention of complication, not stated as uncontrolled    Unspecified essential hypertension    Unspecified hemorrhoids without mention of complication    Past Surgical History:  Procedure Laterality Date   ABDOMINAL HYSTERECTOMY     CESAREAN SECTION     X two   Social History:   reports that she has never smoked. She has never used smokeless tobacco. She reports that she does not drink alcohol and does not use drugs.  Family History  Problem Relation Age of Onset   Hypertension Father    Colon cancer Sister         and Several Aunts   Diabetes Son     Medications: Patient's Medications  New Prescriptions   No medications on file  Previous Medications   ASPIRIN 81 MG TABLET    Take 81 mg by mouth daily.   CITALOPRAM (CELEXA) 20 MG TABLET    Take 20 mg by mouth daily.   DOCUSATE SODIUM (COLACE) 100 MG CAPSULE    Take 100 mg by mouth daily.   DONEPEZIL (ARICEPT) 10 MG TABLET    Take 0.5 tablets (5 mg total) by mouth at bedtime.   HYDROXYPROPYL METHYLCELLULOSE (ISOPTO TEARS) 2.5 % OPHTHALMIC SOLUTION    Place 1 drop into both eyes as needed for dry eyes.    LOSARTAN-HYDROCHLOROTHIAZIDE (HYZAAR) 100-12.5 MG PER TABLET    Take 1 tablet by mouth daily.    LUMIGAN 0.01 % SOLN    Place 1 drop into both eyes daily.   NOVOLOG MIX 70/30 FLEXPEN (70-30) 100 UNIT/ML FLEXPEN    Inject 8-10 Units into the skin 2 (two) times daily. Inject 8 units in the morning and 10 units in the evening   PIOGLITAZONE (ACTOS) 30 MG TABLET    Take 30 mg by mouth daily.    RANITIDINE (ZANTAC) 300 MG TABLET    take 1 tablet by mouth at bedtime (APPOINTMENT REQUIRED FOR REFILLS)  Modified Medications   No medications on file  Discontinued Medications  No medications on file    Physical Exam:  There were no vitals filed for this visit. There is no height or weight on file to calculate BMI. Wt Readings from Last 3 Encounters:  01/02/21 171 lb (77.6 kg)  12/20/20 168 lb 12.8 oz (76.6 kg)  07/27/15 163 lb (73.9 kg)    Physical Exam Vitals and nursing note reviewed.  Constitutional:      Appearance: Normal appearance.  Cardiovascular:     Rate and Rhythm: Normal rate.  Pulmonary:     Effort: Pulmonary effort is normal.  Neurological:     General: No focal deficit present.     Mental Status: She is alert.     Comments: Oriented to person and place    Labs reviewed: Basic Metabolic Panel: Recent Labs    01/02/21 1540  TSH 3.870   Liver Function Tests: No results for input(s): AST, ALT, ALKPHOS, BILITOT,  PROT, ALBUMIN in the last 8760 hours. No results for input(s): LIPASE, AMYLASE in the last 8760 hours. No results for input(s): AMMONIA in the last 8760 hours. CBC: No results for input(s): WBC, NEUTROABS, HGB, HCT, MCV, PLT in the last 8760 hours. Lipid Panel: No results for input(s): CHOL, HDL, LDLCALC, TRIG, CHOLHDL, LDLDIRECT in the last 8760 hours. TSH: Recent Labs    01/02/21 1540  TSH 3.870   A1C: No results found for: HGBA1C   Assessment/Plan  1. Cognitive decline Recent neurologic evaluation which is to include MRI; results are pending.  She was begun on Aricept 5 mg   2. Type 2 diabetes mellitus with diabetic polyneuropathy, without long-term current use of insulin (HCC) Reduce insulin to 14 units in morning and 12 at night.  I have asked her to check sugars at least twice a day once when she gets up and once around suppertime or later.  Son who accompanies her today says that he or his son will watch her check her blood sugars.  Also are holding Actos.  I believe with low blood sugars she is getting too much insulin and we would rather it be high than low blood sugar we will plan to recheck her in 1 month   Jacalyn Lefevre, MD [facility] [phone]

## 2021-01-11 NOTE — Progress Notes (Signed)
Provider:  Jacalyn Lefevre, MD  Careteam: Patient Care Team: Frederica Kuster, MD as PCP - General (Family Medicine)  PLACE OF SERVICE:  [facility]  Advanced Directive information      Chief Complaint  Patient presents with   Acute Visit    Patient presents today for blood sugar concerns. She reports taking 26 units of insulin twice a day.      HPI: Patient is a 87 y.o. female .  Patient lives with son and grandson.  There is concern today about her use of insulin.  She gives her self the insulin and checks her sugar but the result is also great concern regarding her cognitive abilities.  She has been seen recently by neurologist who have diagnosed dementia and started her on Aricept.  She has had several blood sugars below 60 recently testing.  She has been giving herself 26 units of 70/30 insulin as well is taking Actos.  It is probably not appropriate for her to continue administering this insulin herself but we have no alternatives at this time.  Review of Systems:  Review of Systems  Constitutional: Negative.   Respiratory: Negative.    Cardiovascular: Negative.   Genitourinary: Negative.   Psychiatric/Behavioral:  Positive for memory loss.   All other systems reviewed and are negative.  Past Medical History:  Diagnosis Date   Allergic rhinitis, cause unspecified    Chronic pancreatitis (HCC)    Dyspepsia and other specified disorders of function of stomach    GERD (gastroesophageal reflux disease)    Other constipation    Type II or unspecified type diabetes mellitus with ophthalmic manifestations, not stated as uncontrolled(250.50)    Type II or unspecified type diabetes mellitus without mention of complication, not stated as uncontrolled    Unspecified essential hypertension    Unspecified hemorrhoids without mention of complication    Past Surgical History:  Procedure Laterality Date   ABDOMINAL HYSTERECTOMY     CESAREAN SECTION     X two   Social  History:   reports that she has never smoked. She has never used smokeless tobacco. She reports that she does not drink alcohol and does not use drugs.  Family History  Problem Relation Age of Onset   Hypertension Father    Colon cancer Sister        and Several Aunts   Diabetes Son     Medications: Patient's Medications  New Prescriptions   No medications on file  Previous Medications   ASPIRIN 81 MG TABLET    Take 81 mg by mouth daily.   CITALOPRAM (CELEXA) 20 MG TABLET    Take 20 mg by mouth daily.   DOCUSATE SODIUM (COLACE) 100 MG CAPSULE    Take 100 mg by mouth daily.   DONEPEZIL (ARICEPT) 10 MG TABLET    Take 0.5 tablets (5 mg total) by mouth at bedtime.   HYDROXYPROPYL METHYLCELLULOSE (ISOPTO TEARS) 2.5 % OPHTHALMIC SOLUTION    Place 1 drop into both eyes as needed for dry eyes.    LOSARTAN-HYDROCHLOROTHIAZIDE (HYZAAR) 100-12.5 MG PER TABLET    Take 1 tablet by mouth daily.    LUMIGAN 0.01 % SOLN    Place 1 drop into both eyes daily.   NOVOLOG MIX 70/30 FLEXPEN (70-30) 100 UNIT/ML FLEXPEN    Inject 8-10 Units into the skin 2 (two) times daily. Inject 8 units in the morning and 10 units in the evening   PIOGLITAZONE (ACTOS) 30 MG TABLET  Take 30 mg by mouth daily.    RANITIDINE (ZANTAC) 300 MG TABLET    take 1 tablet by mouth at bedtime (APPOINTMENT REQUIRED FOR REFILLS)  Modified Medications   No medications on file  Discontinued Medications   No medications on file    Physical Exam:  Vitals:   01/10/21 1426  BP: 138/68  Pulse: 83  Temp: 97.8 F (36.6 C)  TempSrc: Temporal  SpO2: 98%   There is no height or weight on file to calculate BMI. Wt Readings from Last 3 Encounters:  01/02/21 171 lb (77.6 kg)  12/20/20 168 lb 12.8 oz (76.6 kg)  07/27/15 163 lb (73.9 kg)    Physical Exam Vitals and nursing note reviewed.  Constitutional:      Appearance: Normal appearance.  Cardiovascular:     Rate and Rhythm: Normal rate.  Pulmonary:     Effort: Pulmonary  effort is normal.  Neurological:     General: No focal deficit present.     Mental Status: She is alert.     Comments: Oriented to person and place    Labs reviewed: Basic Metabolic Panel: Recent Labs    01/02/21 1540  TSH 3.870   Liver Function Tests: No results for input(s): AST, ALT, ALKPHOS, BILITOT, PROT, ALBUMIN in the last 8760 hours. No results for input(s): LIPASE, AMYLASE in the last 8760 hours. No results for input(s): AMMONIA in the last 8760 hours. CBC: No results for input(s): WBC, NEUTROABS, HGB, HCT, MCV, PLT in the last 8760 hours. Lipid Panel: No results for input(s): CHOL, HDL, LDLCALC, TRIG, CHOLHDL, LDLDIRECT in the last 8760 hours. TSH: Recent Labs    01/02/21 1540  TSH 3.870   A1C: No results found for: HGBA1C   Assessment/Plan  1. Cognitive decline Recent neurologic evaluation which is to include MRI; results are pending.  She was begun on Aricept 5 mg   2. Type 2 diabetes mellitus with diabetic polyneuropathy, without long-term current use of insulin (HCC) Reduce insulin to 14 units in morning and 12 at night.  I have asked her to check sugars at least twice a day once when she gets up and once around suppertime or later.  Son who accompanies her today says that he or his son will watch her check her blood sugars.  Also are holding Actos.  I believe with low blood sugars she is getting too much insulin and we would rather it be high than low blood sugar we will plan to recheck her in 1 month   Jacalyn Lefevre, MD [facility] [phone]

## 2021-02-14 ENCOUNTER — Ambulatory Visit: Payer: Medicare HMO | Admitting: Family Medicine

## 2021-02-16 ENCOUNTER — Other Ambulatory Visit: Payer: MEDICARE

## 2021-02-16 DIAGNOSIS — E113299 Type 2 diabetes mellitus with mild nonproliferative diabetic retinopathy without macular edema, unspecified eye: Secondary | ICD-10-CM | POA: Insufficient documentation

## 2021-03-09 ENCOUNTER — Other Ambulatory Visit: Payer: MEDICARE

## 2021-03-21 ENCOUNTER — Telehealth: Payer: Self-pay

## 2021-03-21 ENCOUNTER — Ambulatory Visit (INDEPENDENT_AMBULATORY_CARE_PROVIDER_SITE_OTHER): Payer: Medicare HMO | Admitting: Nurse Practitioner

## 2021-03-21 ENCOUNTER — Other Ambulatory Visit: Payer: Self-pay

## 2021-03-21 ENCOUNTER — Encounter: Payer: Self-pay | Admitting: Nurse Practitioner

## 2021-03-21 DIAGNOSIS — Z0000 Encounter for general adult medical examination without abnormal findings: Secondary | ICD-10-CM

## 2021-03-21 NOTE — Progress Notes (Signed)
   This service is provided via telemedicine  No vital signs collected/recorded due to the encounter was a telemedicine visit.   Location of patient (ex: home, work):  Home  Patient consents to a telephone visit: Yes, see telephone visit dated 03/21/21  Location of the provider (ex: office, home):  [employer], [place] Location   Name of any referring provider:  N/A  Names of all persons participating in the telemedicine service and their role in the encounter:  S.Chrae B/CMA, Abbey Chatters, NP, [name] (daughter),and Patient   Time spent on call:  11 min with medical assistant

## 2021-03-21 NOTE — Progress Notes (Signed)
Subjective:   Olivia Roberts is a 87 y.o. female who presents for Medicare Annual (Subsequent) preventive examination.  Review of Systems     Cardiac Risk Factors include: advanced age (>41men, >75 women);diabetes mellitus;hypertension;dyslipidemia;obesity (BMI >30kg/m2)     Objective:    There were no vitals filed for this visit. There is no height or weight on file to calculate BMI.  Advanced Directives 03/21/2021 07/08/2016  Does Patient Have a Medical Advance Directive? Yes No  Type of Advance Directive Living will -  Does patient want to make changes to medical advance directive? No - Patient declined -    Current Medications (verified) Outpatient Encounter Medications as of 03/21/2021  Medication Sig   aspirin 81 MG tablet Take 81 mg by mouth daily.   citalopram (CELEXA) 20 MG tablet Take 20 mg by mouth daily.   docusate sodium (COLACE) 100 MG capsule Take 100 mg by mouth as needed.   hydroxypropyl methylcellulose (ISOPTO TEARS) 2.5 % ophthalmic solution Place 1 drop into both eyes as needed for dry eyes.    losartan-hydrochlorothiazide (HYZAAR) 100-12.5 MG per tablet Take 1 tablet by mouth daily.    LUMIGAN 0.01 % SOLN Place 1 drop into both eyes daily.   NOVOLOG MIX 70/30 FLEXPEN (70-30) 100 UNIT/ML FlexPen Inject 8-10 Units into the skin 2 (two) times daily. Inject 8 units in the morning and 10 units in the evening   pioglitazone (ACTOS) 30 MG tablet Take 30 mg by mouth daily.    ranitidine (ZANTAC) 300 MG tablet take 1 tablet by mouth at bedtime (APPOINTMENT REQUIRED FOR REFILLS)   [DISCONTINUED] donepezil (ARICEPT) 10 MG tablet Take 0.5 tablets (5 mg total) by mouth at bedtime. (Patient not taking: Reported on 03/21/2021)   No facility-administered encounter medications on file as of 03/21/2021.    Allergies (verified) Penicillins   History: Past Medical History:  Diagnosis Date   Allergic rhinitis, cause unspecified    Chronic pancreatitis (HCC)    Dyspepsia  and other specified disorders of function of stomach    GERD (gastroesophageal reflux disease)    Other constipation    Type II or unspecified type diabetes mellitus with ophthalmic manifestations, not stated as uncontrolled(250.50)    Type II or unspecified type diabetes mellitus without mention of complication, not stated as uncontrolled    Unspecified essential hypertension    Unspecified hemorrhoids without mention of complication    Past Surgical History:  Procedure Laterality Date   ABDOMINAL HYSTERECTOMY     CESAREAN SECTION     X two   Family History  Problem Relation Age of Onset   Hypertension Father    Colon cancer Sister        and Several Aunts   Diabetes Son    Social History   Socioeconomic History   Marital status: Widowed    Spouse name: Not on file   Number of children: 3   Years of education: Not on file   Highest education level: Not on file  Occupational History   Occupation: retired  Tobacco Use   Smoking status: Never   Smokeless tobacco: Never  Vaping Use   Vaping Use: Never used  Substance and Sexual Activity   Alcohol use: Never   Drug use: Never   Sexual activity: Not on file  Other Topics Concern   Not on file  Social History Narrative   No caffeine   Social Determinants of Health   Financial Resource Strain: Not on file  Food  Insecurity: Not on file  Transportation Needs: Not on file  Physical Activity: Not on file  Stress: Not on file  Social Connections: Not on file    Tobacco Counseling Counseling given: Not Answered   Clinical Intake:  Pre-visit preparation completed: Yes  Pain : No/denies pain     BMI - recorded: 34 Diabetes: Yes  How often do you need to have someone help you when you read instructions, pamphlets, or other written materials from your doctor or pharmacy?: 4 - Often  Diabetic?yes         Activities of Daily Living In your present state of health, do you have any difficulty performing the  following activities: 03/21/2021  Hearing? Y  Vision? Y  Difficulty concentrating or making decisions? Y  Walking or climbing stairs? N  Dressing or bathing? N  Doing errands, shopping? Y  Comment does not Engineer, manufacturing and eating ? N  Using the Toilet? N  In the past six months, have you accidently leaked urine? N  Do you have problems with loss of bowel control? N  Managing your Medications? Y  Comment family helps  Managing your Finances? Y  Comment family helps  Housekeeping or managing your Housekeeping? N  Some recent data might be hidden    Patient Care Team: Frederica Kuster, MD as PCP - General (Family Medicine)  Indicate any recent Medical Services you may have received from other than [facility] providers in the past year (date may be approximate).     Assessment:   This is a routine wellness examination for Olivia Roberts.  Hearing/Vision screen Hearing Screening - Comments:: Decreased hearing. No hearing aids  Vision Screening - Comments:: Last eye exam less than 12 months ago, every 3 months with Dr.[facility].   Dietary issues and exercise activities discussed:     Goals Addressed   None    Depression Screen PHQ 2/9 Scores 03/21/2021  PHQ - 2 Score 0    Fall Risk Fall Risk  03/21/2021 01/10/2021 01/02/2021  Falls in the past year? 0 0 0  Number falls in past yr: 0 0 -  Injury with Fall? 0 0 -  Risk for fall due to : No Fall Risks No Fall Risks -  Follow up Falls evaluation completed Falls evaluation completed;Education provided;Falls prevention discussed -    FALL RISK PREVENTION PERTAINING TO THE HOME:  Any stairs in or around the home? Yes  If so, are there any without handrails? No  Home free of loose throw rugs in walkways, pet beds, electrical cords, etc? Yes  Adequate lighting in your home to reduce risk of falls? Yes   ASSISTIVE DEVICES UTILIZED TO PREVENT FALLS:  Life alert? No  Use of a cane, walker or w/c? No  Grab bars in the bathroom? Yes   Shower chair or bench in shower? Yes  Elevated toilet seat or a handicapped toilet? Yes   TIMED UP AND GO:  Was the test performed? No .    Cognitive Function: MMSE - Mini Mental State Exam 01/02/2021  Orientation to time 1  Orientation to Place 3  Registration 3  Attention/ Calculation 0  Recall 0  Language- name 2 objects 2  Language- repeat 0  Language- follow 3 step command 3  Language- read & follow direction 1  Write a sentence 1  Copy design 0  Total score 14        Immunizations Immunization History  Administered Date(s) Administered   Influenza, High  Dose Seasonal PF 04/11/2017, 04/28/2018, 05/11/2019   Influenza,inj,quad, With Preservative 05/11/2016   Tdap 02/29/2012    TDAP status: Up to date  Flu Vaccine status: Due, Education has been provided regarding the importance of this vaccine. Advised may receive this vaccine at local pharmacy or Health Dept. Aware to provide a copy of the vaccination record if obtained from local pharmacy or Health Dept. Verbalized acceptance and understanding.  Pneumococcal vaccine status: Due, Education has been provided regarding the importance of this vaccine. Advised may receive this vaccine at local pharmacy or Health Dept. Aware to provide a copy of the vaccination record if obtained from local pharmacy or Health Dept. Verbalized acceptance and understanding.  Covid-19 vaccine status: Information provided on how to obtain vaccines.   Qualifies for Shingles Vaccine? Yes   Zostavax completed No   Shingrix Completed?: No.    Education has been provided regarding the importance of this vaccine. Patient has been advised to call insurance company to determine out of pocket expense if they have not yet received this vaccine. Advised may also receive vaccine at local pharmacy or Health Dept. Verbalized acceptance and understanding.  Screening Tests Health Maintenance  Topic Date Due   HEMOGLOBIN A1C  Never done   COVID-19  Vaccine (1) Never done   FOOT EXAM  Never done   OPHTHALMOLOGY EXAM  Never done   Zoster Vaccines- Shingrix (1 of 2) Never done   INFLUENZA VACCINE  01/23/2021   TETANUS/TDAP  02/28/2022   DEXA SCAN  Completed   HPV VACCINES  Aged Out    Health Maintenance  Health Maintenance Due  Topic Date Due   HEMOGLOBIN A1C  Never done   COVID-19 Vaccine (1) Never done   FOOT EXAM  Never done   OPHTHALMOLOGY EXAM  Never done   Zoster Vaccines- Shingrix (1 of 2) Never done   INFLUENZA VACCINE  01/23/2021    Colorectal cancer screening: No longer required.   Mammogram status: No longer required due to age.  Declined bone density  Lung Cancer Screening: (Low Dose CT Chest recommended if Age 67-80 years, 30 pack-year currently smoking OR have quit w/in 15years.) does not qualify.   Lung Cancer Screening Referral: na  Additional Screening:  Hepatitis C Screening: does not qualify; Completed na  Vision Screening: Recommended annual ophthalmology exams for early detection of glaucoma and other disorders of the eye. Is the patient up to date with their annual eye exam?  Yes  Who is the provider or what is the name of the office in which the patient attends annual eye exams? [facility] If pt is not established with a provider, would they like to be referred to a provider to establish care? No .   Dental Screening: Recommended annual dental exams for proper oral hygiene  Community Resource Referral / Chronic Care Management: CRR required this visit?  No   CCM required this visit?  No      Plan:     I have personally reviewed and noted the following in the patient's chart:   Medical and social history Use of alcohol, tobacco or illicit drugs  Current medications and supplements including opioid prescriptions.  Functional ability and status Nutritional status Physical activity Advanced directives List of other physicians Hospitalizations, surgeries, and ER visits in previous 12  months Vitals Screenings to include cognitive, depression, and falls Referrals and appointments  In addition, I have reviewed and discussed with patient certain preventive protocols, quality metrics, and best practice recommendations. A  written personalized care plan for preventive services as well as general preventive health recommendations were provided to patient.     Sharon Seller, NP   03/21/2021    Virtual Visit via Telephone Note  I connected withNAME@ on 03/21/21 at  1:00 PM EDT by telephone and verified that I am speaking with the correct person using two identifiers.  Location: Patient: home Provider: [facility]   I discussed the limitations, risks, security and privacy concerns of performing an evaluation and management service by telephone and the availability of in person appointments. I also discussed with the patient that there may be a patient responsible charge related to this service. The patient expressed understanding and agreed to proceed.   I discussed the assessment and treatment plan with the patient. The patient was provided an opportunity to ask questions and all were answered. The patient agreed with the plan and demonstrated an understanding of the instructions.   The patient was advised to call back or seek an in-person evaluation if the symptoms worsen or if the condition fails to improve as anticipated.  I provided 15 minutes of non-face-to-face time during this encounter.  Janene Harvey. [address] printed and mailed

## 2021-03-21 NOTE — Telephone Encounter (Signed)
Ms. Olivia Roberts, regas are scheduled for a virtual visit with your provider today.    Just as we do with appointments in the office, we must obtain your consent to participate.  Your consent will be active for this visit and any virtual visit you may have with one of our providers in the next 365 days.    If you have a MyChart account, I can also send a copy of this consent to you electronically.  All virtual visits are billed to your insurance company just like a traditional visit in the office.  As this is a virtual visit, video technology does not allow for your provider to perform a traditional examination.  This may limit your provider's ability to fully assess your condition.  If your provider identifies any concerns that need to be evaluated in person or the need to arrange testing such as labs, EKG, etc, we will make arrangements to do so.    Although advances in technology are sophisticated, we cannot ensure that it will always work on either your end or our end.  If the connection with a video visit is poor, we may have to switch to a telephone visit.  With either a video or telephone visit, we are not always able to ensure that we have a secure connection.   I need to obtain your verbal consent now.   Are you willing to proceed with your visit today?   Olivia Roberts and daughter [name], has provided verbal consent on 03/21/2021 for a virtual visit (video or telephone).   [name], [place] 03/21/2021  1:04 PM

## 2021-03-21 NOTE — Patient Instructions (Addendum)
Ms. Olivia Roberts , Thank you for taking time to come for your Medicare Wellness Visit. I appreciate your ongoing commitment to your health goals. Please review the following plan we discussed and let me know if I can assist you in the future.   Screening recommendations/referrals: Colonoscopy aged out Mammogram aged out Bone Density declined Recommended yearly ophthalmology/optometry visit for glaucoma screening and checkup Recommended yearly dental visit for hygiene and checkup  Vaccinations: Influenza vaccine DUE- to get at local pharmacy or in office  Pneumococcal vaccine RECOMMENDED- to get at local pharmacy or office.  Tdap vaccine up to date Shingles vaccine RECOMMENDED- shingrix recommended at local pharmacy COVID booster-   recommended to get at local pharmacy  Advanced directives: recommend to bring copy into office.   Conditions/risks identified: advanced age, complications related to uncontrolled diabetes, obesity  Next appointment: yearly    Preventive Care 85 Years and Older, Female Preventive care refers to lifestyle choices and visits with your health care provider that can promote health and wellness. What does preventive care include? A yearly physical exam. This is also called an annual well check. Dental exams once or twice a year. Routine eye exams. Ask your health care provider how often you should have your eyes checked. Personal lifestyle choices, including: Daily care of your teeth and gums. Regular physical activity. Eating a healthy diet. Avoiding tobacco and drug use. Limiting alcohol use. Practicing safe sex. Taking low-dose aspirin every day. Taking vitamin and mineral supplements as recommended by your health care provider. What happens during an annual well check? The services and screenings done by your health care provider during your annual well check will depend on your age, overall health, lifestyle risk factors, and family history of  disease. Counseling  Your health care provider may ask you questions about your: Alcohol use. Tobacco use. Drug use. Emotional well-being. Home and relationship well-being. Sexual activity. Eating habits. History of falls. Memory and ability to understand (cognition). Work and work Astronomer. Reproductive health. Screening  You may have the following tests or measurements: Height, weight, and BMI. Blood pressure. Lipid and cholesterol levels. These may be checked every 5 years, or more frequently if you are over 20 years old. Skin check. Lung cancer screening. You may have this screening every year starting at age 64 if you have a 30-pack-year history of smoking and currently smoke or have quit within the past 15 years. Fecal occult blood test (FOBT) of the stool. You may have this test every year starting at age 20. Flexible sigmoidoscopy or colonoscopy. You may have a sigmoidoscopy every 5 years or a colonoscopy every 10 years starting at age 55. Hepatitis C blood test. Hepatitis B blood test. Sexually transmitted disease (STD) testing. Diabetes screening. This is done by checking your blood sugar (glucose) after you have not eaten for a while (fasting). You may have this done every 1-3 years. Bone density scan. This is done to screen for osteoporosis. You may have this done starting at age 70. Mammogram. This may be done every 1-2 years. Talk to your health care provider about how often you should have regular mammograms. Talk with your health care provider about your test results, treatment options, and if necessary, the need for more tests. Vaccines  Your health care provider may recommend certain vaccines, such as: Influenza vaccine. This is recommended every year. Tetanus, diphtheria, and acellular pertussis (Tdap, Td) vaccine. You may need a Td booster every 10 years. Zoster vaccine. You may need this after  age 64. Pneumococcal 13-valent conjugate (PCV13) vaccine. One  dose is recommended after age 56. Pneumococcal polysaccharide (PPSV23) vaccine. One dose is recommended after age 8. Talk to your health care provider about which screenings and vaccines you need and how often you need them. This information is not intended to replace advice given to you by your health care provider. Make sure you discuss any questions you have with your health care provider. Document Released: 07/08/2015 Document Revised: 02/29/2016 Document Reviewed: 04/12/2015 Elsevier Interactive Patient Education  2017 [employer] Prevention in the Home Falls can cause injuries. They can happen to people of all ages. There are many things you can do to make your home safe and to help prevent falls. What can I do on the outside of my home? Regularly fix the edges of walkways and driveways and fix any cracks. Remove anything that might make you trip as you walk through a door, such as a raised step or threshold. Trim any bushes or trees on the path to your home. Use bright outdoor lighting. Clear any walking paths of anything that might make someone trip, such as rocks or tools. Regularly check to see if handrails are loose or broken. Make sure that both sides of any steps have handrails. Any raised decks and porches should have guardrails on the edges. Have any leaves, snow, or ice cleared regularly. Use sand or salt on walking paths during winter. Clean up any spills in your garage right away. This includes oil or grease spills. What can I do in the bathroom? Use night lights. Install grab bars by the toilet and in the tub and shower. Do not use towel bars as grab bars. Use non-skid mats or decals in the tub or shower. If you need to sit down in the shower, use a plastic, non-slip stool. Keep the floor dry. Clean up any water that spills on the floor as soon as it happens. Remove soap buildup in the tub or shower regularly. Attach bath mats securely with double-sided  non-slip rug tape. Do not have throw rugs and other things on the floor that can make you trip. What can I do in the bedroom? Use night lights. Make sure that you have a light by your bed that is easy to reach. Do not use any sheets or blankets that are too big for your bed. They should not hang down onto the floor. Have a firm chair that has side arms. You can use this for support while you get dressed. Do not have throw rugs and other things on the floor that can make you trip. What can I do in the kitchen? Clean up any spills right away. Avoid walking on wet floors. Keep items that you use a lot in easy-to-reach places. If you need to reach something above you, use a strong step stool that has a grab bar. Keep electrical cords out of the way. Do not use floor polish or wax that makes floors slippery. If you must use wax, use non-skid floor wax. Do not have throw rugs and other things on the floor that can make you trip. What can I do with my stairs? Do not leave any items on the stairs. Make sure that there are handrails on both sides of the stairs and use them. Fix handrails that are broken or loose. Make sure that handrails are as long as the stairways. Check any carpeting to make sure that it is firmly attached to the stairs.  Fix any carpet that is loose or worn. Avoid having throw rugs at the top or bottom of the stairs. If you do have throw rugs, attach them to the floor with carpet tape. Make sure that you have a light switch at the top of the stairs and the bottom of the stairs. If you do not have them, ask someone to add them for you. What else can I do to help prevent falls? Wear shoes that: Do not have high heels. Have rubber bottoms. Are comfortable and fit you well. Are closed at the toe. Do not wear sandals. If you use a stepladder: Make sure that it is fully opened. Do not climb a closed stepladder. Make sure that both sides of the stepladder are locked into place. Ask  someone to hold it for you, if possible. Clearly mark and make sure that you can see: Any grab bars or handrails. First and last steps. Where the edge of each step is. Use tools that help you move around (mobility aids) if they are needed. These include: Canes. Walkers. Scooters. Crutches. Turn on the lights when you go into a dark area. Replace any light bulbs as soon as they burn out. Set up your furniture so you have a clear path. Avoid moving your furniture around. If any of your floors are uneven, fix them. If there are any pets around you, be aware of where they are. Review your medicines with your doctor. Some medicines can make you feel dizzy. This can increase your chance of falling. Ask your doctor what other things that you can do to help prevent falls. This information is not intended to replace advice given to you by your health care provider. Make sure you discuss any questions you have with your health care provider. Document Released: 04/07/2009 Document Revised: 11/17/2015 Document Reviewed: 07/16/2014 Elsevier Interactive Patient Education  2017 [employer].

## 2021-03-24 ENCOUNTER — Other Ambulatory Visit: Payer: MEDICARE

## 2021-04-06 ENCOUNTER — Ambulatory Visit: Payer: MEDICARE | Admitting: Neurology

## 2021-04-25 ENCOUNTER — Other Ambulatory Visit: Payer: Self-pay

## 2021-04-25 ENCOUNTER — Ambulatory Visit (INDEPENDENT_AMBULATORY_CARE_PROVIDER_SITE_OTHER): Payer: Medicare HMO | Admitting: Family Medicine

## 2021-04-25 ENCOUNTER — Encounter: Payer: Self-pay | Admitting: Family Medicine

## 2021-04-25 VITALS — BP 136/74 | HR 77 | Temp 98.4°F | Ht 59.0 in | Wt 167.2 lb

## 2021-04-25 DIAGNOSIS — I152 Hypertension secondary to endocrine disorders: Secondary | ICD-10-CM

## 2021-04-25 DIAGNOSIS — E1169 Type 2 diabetes mellitus with other specified complication: Secondary | ICD-10-CM

## 2021-04-25 DIAGNOSIS — E1159 Type 2 diabetes mellitus with other circulatory complications: Secondary | ICD-10-CM

## 2021-04-25 DIAGNOSIS — E669 Obesity, unspecified: Secondary | ICD-10-CM

## 2021-04-25 DIAGNOSIS — R4189 Other symptoms and signs involving cognitive functions and awareness: Secondary | ICD-10-CM | POA: Diagnosis not present

## 2021-04-25 DIAGNOSIS — E1142 Type 2 diabetes mellitus with diabetic polyneuropathy: Secondary | ICD-10-CM

## 2021-04-25 DIAGNOSIS — Z23 Encounter for immunization: Secondary | ICD-10-CM

## 2021-04-25 NOTE — Progress Notes (Signed)
Provider:  Jacalyn Lefevre, MD  Careteam: Patient Care Team: Frederica Kuster, MD as PCP - General (Family Medicine)  PLACE OF SERVICE:  [facility]  Advanced Directive information      Chief Complaint  Patient presents with   Medical Management of Chronic Issues    4 month follow-up. Discuss need for covid, pcv, shingrix, and eye exam or exclude/postpone if patient refuses recommendation. Foot exam, A1c  and flu vaccine today       HPI: Patient is a 87 y.o. female .  She has a virtual visit with endocrinologist at [facility].  We have been going back and forth with her insulin dosing since she transferred here from her local endocrinologist who retired.  A1c's are not at target which has been set by endocrinology at 8-8.5.  Patient gives herself insulin but she does have some cognitive issues and I think this is a area of problem. She is leaving this appointment to go see her eye doctor for annual exam  Review of Systems:  Review of Systems  Constitutional: Negative.   Eyes: Negative.   Respiratory: Negative.    Cardiovascular: Negative.   Musculoskeletal: Negative.   Psychiatric/Behavioral:  Positive for memory loss.   All other systems reviewed and are negative.  Past Medical History:  Diagnosis Date   Allergic rhinitis, cause unspecified    Chronic pancreatitis (HCC)    Dyspepsia and other specified disorders of function of stomach    GERD (gastroesophageal reflux disease)    Other constipation    Type II or unspecified type diabetes mellitus with ophthalmic manifestations, not stated as uncontrolled(250.50)    Type II or unspecified type diabetes mellitus without mention of complication, not stated as uncontrolled    Unspecified essential hypertension    Unspecified hemorrhoids without mention of complication    Past Surgical History:  Procedure Laterality Date   ABDOMINAL HYSTERECTOMY     CESAREAN SECTION     X two   Social History:   reports that she  has never smoked. She has never used smokeless tobacco. She reports that she does not drink alcohol and does not use drugs.  Family History  Problem Relation Age of Onset   Hypertension Father    Colon cancer Sister        and Several Aunts   Diabetes Son     Medications: Patient's Medications  New Prescriptions   No medications on file  Previous Medications   ASPIRIN 81 MG TABLET    Take 81 mg by mouth daily.   CITALOPRAM (CELEXA) 20 MG TABLET    Take 20 mg by mouth daily.   DOCUSATE SODIUM (COLACE) 100 MG CAPSULE    Take 100 mg by mouth as needed.   HYDROXYPROPYL METHYLCELLULOSE (ISOPTO TEARS) 2.5 % OPHTHALMIC SOLUTION    Place 1 drop into both eyes as needed for dry eyes.    LOSARTAN-HYDROCHLOROTHIAZIDE (HYZAAR) 100-12.5 MG PER TABLET    Take 1 tablet by mouth daily.    LUMIGAN 0.01 % SOLN    Place 1 drop into both eyes daily.   NOVOLOG MIX 70/30 FLEXPEN (70-30) 100 UNIT/ML FLEXPEN    Inject 8-10 Units into the skin 2 (two) times daily. Inject 8 units in the morning and 10 units in the evening   PIOGLITAZONE (ACTOS) 30 MG TABLET    Take 30 mg by mouth daily.    RANITIDINE (ZANTAC) 300 MG TABLET    take 1 tablet by mouth at  bedtime (APPOINTMENT REQUIRED FOR REFILLS)  Modified Medications   No medications on file  Discontinued Medications   No medications on file    Physical Exam:  There were no vitals filed for this visit. There is no height or weight on file to calculate BMI. Wt Readings from Last 3 Encounters:  01/02/21 171 lb (77.6 kg)  12/20/20 168 lb 12.8 oz (76.6 kg)  07/27/15 163 lb (73.9 kg)    Physical Exam Vitals and nursing note reviewed.  Constitutional:      Appearance: Normal appearance.  HENT:     Head: Normocephalic.  Cardiovascular:     Rate and Rhythm: Normal rate and regular rhythm.  Pulmonary:     Effort: Pulmonary effort is normal.     Breath sounds: Normal breath sounds.  Neurological:     Mental Status: She is alert.    Labs  reviewed: Basic Metabolic Panel: Recent Labs    01/02/21 1540  TSH 3.870   Liver Function Tests: No results for input(s): AST, ALT, ALKPHOS, BILITOT, PROT, ALBUMIN in the last 8760 hours. No results for input(s): LIPASE, AMYLASE in the last 8760 hours. No results for input(s): AMMONIA in the last 8760 hours. CBC: No results for input(s): WBC, NEUTROABS, HGB, HCT, MCV, PLT in the last 8760 hours. Lipid Panel: No results for input(s): CHOL, HDL, LDLCALC, TRIG, CHOLHDL, LDLDIRECT in the last 8760 hours. TSH: Recent Labs    01/02/21 1540  TSH 3.870   A1C: No results found for: HGBA1C   Assessment/Plan  1. Need for influenza vaccination  - Flu Vaccine QUAD High Dose(Fluad)  2. Cognitive decline  Patient gives herself insulin but I think there is some question about her understanding of dosing.  I have asked her daughter to supervise her giving herself insulin.  As I review her glucometer results numbers vary widely.  This could be dietary but could also be related to variable doses of insulin that she takes 3. Hypertension associated with diabetes (HCC) Blood pressure is acceptable on current regimen   4. Diabetes mellitus type 2 in obese (HCC) We will proceed with insulin 70/30: 20 units in a.m. and 15 units in p.m.  Patient and family will monitor sugars and record results.  She will return in 2 months at which time we will repeat A1c.   Jacalyn Lefevre, MD [facility] [phone]

## 2021-06-27 ENCOUNTER — Encounter: Payer: Self-pay | Admitting: Family Medicine

## 2021-06-27 ENCOUNTER — Ambulatory Visit (INDEPENDENT_AMBULATORY_CARE_PROVIDER_SITE_OTHER): Payer: Medicare HMO | Admitting: Family Medicine

## 2021-06-27 ENCOUNTER — Other Ambulatory Visit: Payer: Self-pay

## 2021-06-27 VITALS — BP 132/80 | HR 84 | Temp 97.1°F | Ht 59.0 in | Wt 160.4 lb

## 2021-06-27 DIAGNOSIS — E1169 Type 2 diabetes mellitus with other specified complication: Secondary | ICD-10-CM | POA: Diagnosis not present

## 2021-06-27 DIAGNOSIS — G301 Alzheimer's disease with late onset: Secondary | ICD-10-CM

## 2021-06-27 DIAGNOSIS — R4189 Other symptoms and signs involving cognitive functions and awareness: Secondary | ICD-10-CM | POA: Diagnosis not present

## 2021-06-27 DIAGNOSIS — E669 Obesity, unspecified: Secondary | ICD-10-CM

## 2021-06-27 DIAGNOSIS — F0280 Dementia in other diseases classified elsewhere without behavioral disturbance: Secondary | ICD-10-CM

## 2021-06-27 NOTE — Patient Instructions (Signed)
Increase AM insulin to 24 units; continue 15 units afternoon

## 2021-06-27 NOTE — Progress Notes (Signed)
Provider:  Alain Honey, MD  Careteam: Patient Care Team: Wardell Honour, MD as PCP - General (Family Medicine)  PLACE OF SERVICE:  [place] Directive information      Chief Complaint  Patient presents with   Medical Management of Chronic Issues    Patient presents today for a 2 month follow-up.   Quality Metric Gaps    A1C, pneumonia, zoster, COVID, eye exam     HPI: Patient is a 88 y.o. female she is here to follow-up her diabetes and dementia.  She continues to live by herself with supervision from her children.  She cooks and cleans for herself and gives herself insulin.  There was some question previously about administration of insulin. Daughter who accompanies her today is confident that patient is getting right amounts of insulin, that is 20 units in the morning and 15 units in the late afternoon.  Since her last visit she saw ophthalmologist. She has no new complaints today.  Sleeping well.  No falls.  No complaints related to bowel or bladder function.  She denies numbness in her feet. She brings me a record of her glucose testing numbers are generally in the 2 and 300s.  Her previous doctor, an endocrinologist suggested that we keep her A1c between 8 and 8.5.  In review of her recordings of blood sugar generally it is higher in the afternoon.  Review of Systems:  Review of Systems  Constitutional: Negative.   Eyes: Negative.   Respiratory: Negative.    Cardiovascular: Negative.   Genitourinary: Negative.   Psychiatric/Behavioral:  Positive for memory loss.   All other systems reviewed and are negative.  Past Medical History:  Diagnosis Date   Allergic rhinitis, cause unspecified    Chronic pancreatitis ([place])    Dyspepsia and other specified disorders of function of stomach    GERD (gastroesophageal reflux disease)    Other constipation    Type II or unspecified type diabetes mellitus with ophthalmic manifestations, not stated as  uncontrolled(250.50)    Type II or unspecified type diabetes mellitus without mention of complication, not stated as uncontrolled    Unspecified essential hypertension    Unspecified hemorrhoids without mention of complication    Past Surgical History:  Procedure Laterality Date   ABDOMINAL HYSTERECTOMY     CESAREAN SECTION     X two   Social History:   reports that she has never smoked. She has never used smokeless tobacco. She reports that she does not drink alcohol and does not use drugs.  Family History  Problem Relation Age of Onset   Hypertension Father    Colon cancer Sister        and Several Aunts   Diabetes Son     Medications: Patient's Medications  New Prescriptions   No medications on file  Previous Medications   ASPIRIN 81 MG TABLET    Take 81 mg by mouth daily.   CITALOPRAM (CELEXA) 20 MG TABLET    Take 20 mg by mouth daily.   DOCUSATE SODIUM (COLACE) 100 MG CAPSULE    Take 100 mg by mouth as needed.   HYDROXYPROPYL METHYLCELLULOSE (ISOPTO TEARS) 2.5 % OPHTHALMIC SOLUTION    Place 1 drop into both eyes as needed for dry eyes.    LOSARTAN-HYDROCHLOROTHIAZIDE (HYZAAR) 100-12.5 MG PER TABLET    Take 1 tablet by mouth daily.    LUMIGAN 0.01 % SOLN    Place 1 drop into both eyes daily.  NOVOLOG MIX 70/30 FLEXPEN (70-30) 100 UNIT/ML FLEXPEN    Inject 8-10 Units into the skin 2 (two) times daily. Inject 8 units in the morning and 10 units in the evening   PIOGLITAZONE (ACTOS) 30 MG TABLET    Take 30 mg by mouth daily.    RANITIDINE (ZANTAC) 300 MG TABLET    take 1 tablet by mouth at bedtime (APPOINTMENT REQUIRED FOR REFILLS)  Modified Medications   No medications on file  Discontinued Medications   No medications on file    Physical Exam:  Vitals:   06/27/21 1309  BP: 132/80  Pulse: 84  Temp: (!) 97.1 F (36.2 C)  SpO2: 97%  Weight: 160 lb 6.4 oz (72.8 kg)  Height: 4\' 11"  (1.499 m)   Body mass index is 32.4 kg/m. Wt Readings from Last 3 Encounters:   06/27/21 160 lb 6.4 oz (72.8 kg)  04/25/21 167 lb 3.2 oz (75.8 kg)  01/02/21 171 lb (77.6 kg)    Physical Exam Vitals and nursing note reviewed.  Constitutional:      Appearance: Normal appearance.  Cardiovascular:     Rate and Rhythm: Normal rate and regular rhythm.  Pulmonary:     Effort: Pulmonary effort is normal.     Breath sounds: Normal breath sounds.  Musculoskeletal:        General: Normal range of motion.  Neurological:     General: No focal deficit present.     Mental Status: She is oriented to person, place, and time.    Labs reviewed: Basic Metabolic Panel: Recent Labs    01/02/21 1540  TSH 3.870   Liver Function Tests: No results for input(s): AST, ALT, ALKPHOS, BILITOT, PROT, ALBUMIN in the last 8760 hours. No results for input(s): LIPASE, AMYLASE in the last 8760 hours. No results for input(s): AMMONIA in the last 8760 hours. CBC: No results for input(s): WBC, NEUTROABS, HGB, HCT, MCV, PLT in the last 8760 hours. Lipid Panel: No results for input(s): CHOL, HDL, LDLCALC, TRIG, CHOLHDL, LDLDIRECT in the last 8760 hours. TSH: Recent Labs    01/02/21 1540  TSH 3.870   A1C: No results found for: HGBA1C   Assessment/Plan  1. Diabetes mellitus type 2 in obese (HCC) Will check A1c today.  Based on readings from home we will increase a.m. insulin to 24 units and maintain 15 units in the afternoon  2. Cognitive decline Basically unchanged from last visit.  Still functioning mostly independently  3. Late onset Alzheimer's dementia without behavioral disturbance ([place]) Has seen neurologist for this problem.  Not currently on any phosphodiesterase inhibitor   Alain Honey, MD [place] [phone]

## 2021-06-28 LAB — HEMOGLOBIN A1C
Hgb A1c MFr Bld: 12.2 % of total Hgb — ABNORMAL HIGH (ref <5.7)
Mean Plasma Glucose: 303 mg/dL
eAG (mmol/L): 16.8 mmol/L

## 2021-07-10 ENCOUNTER — Other Ambulatory Visit: Payer: Self-pay | Admitting: Neurology

## 2021-08-29 DIAGNOSIS — H31091 Other chorioretinal scars, right eye: Secondary | ICD-10-CM | POA: Diagnosis not present

## 2021-08-29 DIAGNOSIS — H401112 Primary open-angle glaucoma, right eye, moderate stage: Secondary | ICD-10-CM | POA: Diagnosis not present

## 2021-08-29 DIAGNOSIS — H401121 Primary open-angle glaucoma, left eye, mild stage: Secondary | ICD-10-CM | POA: Diagnosis not present

## 2021-08-29 DIAGNOSIS — H25813 Combined forms of age-related cataract, bilateral: Secondary | ICD-10-CM | POA: Diagnosis not present

## 2021-08-29 DIAGNOSIS — H40012 Open angle with borderline findings, low risk, left eye: Secondary | ICD-10-CM | POA: Diagnosis not present

## 2021-10-31 ENCOUNTER — Encounter: Payer: Self-pay | Admitting: Family Medicine

## 2021-10-31 ENCOUNTER — Ambulatory Visit (INDEPENDENT_AMBULATORY_CARE_PROVIDER_SITE_OTHER): Payer: Medicare (Managed Care) | Admitting: Family Medicine

## 2021-10-31 VITALS — BP 130/86 | HR 71 | Temp 97.5°F

## 2021-10-31 DIAGNOSIS — E0840 Diabetes mellitus due to underlying condition with diabetic neuropathy, unspecified: Secondary | ICD-10-CM

## 2021-10-31 DIAGNOSIS — R4189 Other symptoms and signs involving cognitive functions and awareness: Secondary | ICD-10-CM | POA: Diagnosis not present

## 2021-10-31 DIAGNOSIS — I152 Hypertension secondary to endocrine disorders: Secondary | ICD-10-CM | POA: Diagnosis not present

## 2021-10-31 DIAGNOSIS — Z794 Long term (current) use of insulin: Secondary | ICD-10-CM | POA: Diagnosis not present

## 2021-10-31 DIAGNOSIS — E1159 Type 2 diabetes mellitus with other circulatory complications: Secondary | ICD-10-CM

## 2021-10-31 DIAGNOSIS — E1343 Other specified diabetes mellitus with diabetic autonomic (poly)neuropathy: Secondary | ICD-10-CM | POA: Diagnosis not present

## 2021-10-31 NOTE — Progress Notes (Signed)
? ? ?Provider:  ?Jacalyn Lefevre, MD ? ?Careteam: ?Patient Care Team: ?Frederica Kuster, MD as PCP - General (Family Medicine) ? ?PLACE OF SERVICE:  ?[facility]  ?Advanced Directive information ?  ? ? ? ?Chief Complaint  ?Patient presents with  ? Medical Management of Chronic Issues  ?  Patient presents today for a 4 month follow-up.  ? Quality Metric Gaps  ?  Eye exam, COVID, zoster, pneumonia   ? ? ? ?HPI: Patient is a 88 y.o. female 88 year old female here to follow-up type 2 diabetes and Alzheimer's dementia.  Sugars have been wildly fluctuating when the female members of her family are overseeing her injections and testing.  Now her daughter goes by her house 3 times a day and does the injections and testing.  During that time she was also switched from Novolin 70/30 to Humulin 70/30.  Sugars still fluctuate but less so. ?Cognitively, there have been no changes in the last 3 months. ? ?Review of Systems:  ?Review of Systems  ?Constitutional: Negative.   ?HENT: Negative.    ?Eyes: Negative.   ?Respiratory: Negative.    ?Cardiovascular: Negative.   ?Gastrointestinal: Negative.   ?Musculoskeletal: Negative.   ?Neurological: Negative.   ?Psychiatric/Behavioral:  Positive for memory loss.   ? ?Past Medical History:  ?Diagnosis Date  ? Allergic rhinitis, cause unspecified   ? Chronic pancreatitis (HCC)   ? Dyspepsia and other specified disorders of function of stomach   ? GERD (gastroesophageal reflux disease)   ? Other constipation   ? Type II or unspecified type diabetes mellitus with ophthalmic manifestations, not stated as uncontrolled(250.50)   ? Type II or unspecified type diabetes mellitus without mention of complication, not stated as uncontrolled   ? Unspecified essential hypertension   ? Unspecified hemorrhoids without mention of complication   ? ?Past Surgical History:  ?Procedure Laterality Date  ? ABDOMINAL HYSTERECTOMY    ? CESAREAN SECTION    ? X two  ? ?Social History: ?  reports that she has never  smoked. She has never used smokeless tobacco. She reports that she does not drink alcohol and does not use drugs. ? ?Family History  ?Problem Relation Age of Onset  ? Hypertension Father   ? Colon cancer Sister   ?     and Several Aunts  ? Diabetes Son   ? ? ?Medications: ?Patient's Medications  ?New Prescriptions  ? No medications on file  ?Previous Medications  ? ASPIRIN 81 MG TABLET    Take 81 mg by mouth daily.  ? CITALOPRAM (CELEXA) 20 MG TABLET    Take 20 mg by mouth daily.  ? DOCUSATE SODIUM (COLACE) 100 MG CAPSULE    Take 100 mg by mouth as needed.  ? HYDROXYPROPYL METHYLCELLULOSE (ISOPTO TEARS) 2.5 % OPHTHALMIC SOLUTION    Place 1 drop into both eyes as needed for dry eyes.   ? LOSARTAN-HYDROCHLOROTHIAZIDE (HYZAAR) 100-12.5 MG PER TABLET    Take 1 tablet by mouth daily.   ? LUMIGAN 0.01 % SOLN    Place 1 drop into both eyes daily.  ? NOVOLOG MIX 70/30 FLEXPEN (70-30) 100 UNIT/ML FLEXPEN    Inject 8-10 Units into the skin 2 (two) times daily. Inject 8 units in the morning and 10 units in the evening  ? PIOGLITAZONE (ACTOS) 30 MG TABLET    Take 30 mg by mouth daily.   ? RANITIDINE (ZANTAC) 300 MG TABLET    take 1 tablet by mouth at bedtime (APPOINTMENT REQUIRED  FOR REFILLS)  ?Modified Medications  ? No medications on file  ?Discontinued Medications  ? No medications on file  ? ? ?Physical Exam: ? ?There were no vitals filed for this visit. ?There is no height or weight on file to calculate BMI. ?Wt Readings from Last 3 Encounters:  ?06/27/21 160 lb 6.4 oz (72.8 kg)  ?04/25/21 167 lb 3.2 oz (75.8 kg)  ?01/02/21 171 lb (77.6 kg)  ? ? ?Physical Exam ?Vitals and nursing note reviewed.  ?Constitutional:   ?   Appearance: Normal appearance.  ?Cardiovascular:  ?   Rate and Rhythm: Normal rate and regular rhythm.  ?Pulmonary:  ?   Effort: Pulmonary effort is normal.  ?   Breath sounds: Normal breath sounds.  ?Neurological:  ?   General: No focal deficit present.  ?   Mental Status: She is alert and oriented to  person, place, and time.  ?Psychiatric:     ?   Mood and Affect: Mood normal.     ?   Behavior: Behavior normal.  ? ? ?Labs reviewed: ?Basic Metabolic Panel: ?Recent Labs  ?  01/02/21 ?1540  ?TSH 3.870  ? ?Liver Function Tests: ?No results for input(s): AST, ALT, ALKPHOS, BILITOT, PROT, ALBUMIN in the last 8760 hours. ?No results for input(s): LIPASE, AMYLASE in the last 8760 hours. ?No results for input(s): AMMONIA in the last 8760 hours. ?CBC: ?No results for input(s): WBC, NEUTROABS, HGB, HCT, MCV, PLT in the last 8760 hours. ?Lipid Panel: ?No results for input(s): CHOL, HDL, LDLCALC, TRIG, CHOLHDL, LDLDIRECT in the last 8760 hours. ?TSH: ?Recent Labs  ?  01/02/21 ?1540  ?TSH 3.870  ? ?A1C: ?Lab Results  ?Component Value Date  ? HGBA1C 12.2 (H) 06/27/2021  ? ? ? ?Assessment/Plan ? ?1. Cognitive decline ?This is stable at present she continues on citalopram 20 mg. ? ?2. 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  Her daughter has been switching doses almost daily depending on blood sugar testing.  I would like to find some stability in her dosing and toward that end I have asked her to take 26 units in the morning and 16 in the evening.  Continue to monitor his sugars and will make changes after 2 weeks of data ? ?3. Hypertension associated with diabetes (HCC) ?Blood pressure is good today at 130/86 ? ? ?Jacalyn Lefevre, MD ?[facility] ?[phone]  ? ?

## 2021-11-28 ENCOUNTER — Ambulatory Visit: Payer: Medicare (Managed Care) | Admitting: Family Medicine

## 2022-01-05 DIAGNOSIS — E1122 Type 2 diabetes mellitus with diabetic chronic kidney disease: Secondary | ICD-10-CM | POA: Diagnosis not present

## 2022-01-05 DIAGNOSIS — Z794 Long term (current) use of insulin: Secondary | ICD-10-CM | POA: Diagnosis not present

## 2022-01-05 DIAGNOSIS — E1165 Type 2 diabetes mellitus with hyperglycemia: Secondary | ICD-10-CM | POA: Diagnosis not present

## 2022-01-05 DIAGNOSIS — E11649 Type 2 diabetes mellitus with hypoglycemia without coma: Secondary | ICD-10-CM | POA: Diagnosis not present

## 2022-01-05 DIAGNOSIS — E559 Vitamin D deficiency, unspecified: Secondary | ICD-10-CM | POA: Diagnosis not present

## 2022-01-05 DIAGNOSIS — E1169 Type 2 diabetes mellitus with other specified complication: Secondary | ICD-10-CM | POA: Diagnosis not present

## 2022-01-05 DIAGNOSIS — I129 Hypertensive chronic kidney disease with stage 1 through stage 4 chronic kidney disease, or unspecified chronic kidney disease: Secondary | ICD-10-CM | POA: Diagnosis not present

## 2022-01-05 DIAGNOSIS — N182 Chronic kidney disease, stage 2 (mild): Secondary | ICD-10-CM | POA: Diagnosis not present

## 2022-01-05 DIAGNOSIS — E785 Hyperlipidemia, unspecified: Secondary | ICD-10-CM | POA: Diagnosis not present

## 2022-01-09 DIAGNOSIS — E1122 Type 2 diabetes mellitus with diabetic chronic kidney disease: Secondary | ICD-10-CM | POA: Diagnosis not present

## 2022-01-09 DIAGNOSIS — E785 Hyperlipidemia, unspecified: Secondary | ICD-10-CM | POA: Diagnosis not present

## 2022-01-09 DIAGNOSIS — Z794 Long term (current) use of insulin: Secondary | ICD-10-CM | POA: Diagnosis not present

## 2022-01-09 DIAGNOSIS — I129 Hypertensive chronic kidney disease with stage 1 through stage 4 chronic kidney disease, or unspecified chronic kidney disease: Secondary | ICD-10-CM | POA: Diagnosis not present

## 2022-01-09 DIAGNOSIS — E559 Vitamin D deficiency, unspecified: Secondary | ICD-10-CM | POA: Diagnosis not present

## 2022-01-09 DIAGNOSIS — E669 Obesity, unspecified: Secondary | ICD-10-CM | POA: Diagnosis not present

## 2022-01-09 DIAGNOSIS — E1169 Type 2 diabetes mellitus with other specified complication: Secondary | ICD-10-CM | POA: Diagnosis not present

## 2022-01-09 DIAGNOSIS — E11649 Type 2 diabetes mellitus with hypoglycemia without coma: Secondary | ICD-10-CM | POA: Diagnosis not present

## 2022-01-09 DIAGNOSIS — E1165 Type 2 diabetes mellitus with hyperglycemia: Secondary | ICD-10-CM | POA: Diagnosis not present

## 2022-01-09 DIAGNOSIS — Z6833 Body mass index (BMI) 33.0-33.9, adult: Secondary | ICD-10-CM | POA: Diagnosis not present

## 2022-01-09 DIAGNOSIS — N182 Chronic kidney disease, stage 2 (mild): Secondary | ICD-10-CM | POA: Diagnosis not present

## 2022-01-23 ENCOUNTER — Other Ambulatory Visit: Payer: Self-pay | Admitting: *Deleted

## 2022-01-23 MED ORDER — CITALOPRAM HYDROBROMIDE 20 MG PO TABS: 20.0000 mg | ORAL_TABLET | Freq: Every day | ORAL | 1 refills | Status: DC

## 2022-01-23 NOTE — Telephone Encounter (Signed)
Patient daughter requested refill.  

## 2022-02-06 ENCOUNTER — Other Ambulatory Visit: Payer: Self-pay | Admitting: *Deleted

## 2022-02-06 ENCOUNTER — Ambulatory Visit: Payer: Medicare (Managed Care) | Admitting: Family Medicine

## 2022-02-06 ENCOUNTER — Encounter: Payer: Self-pay | Admitting: Family Medicine

## 2022-02-06 VITALS — BP 132/86 | HR 67 | Temp 97.3°F | Ht 59.0 in | Wt 171.2 lb

## 2022-02-06 DIAGNOSIS — E1159 Type 2 diabetes mellitus with other circulatory complications: Secondary | ICD-10-CM

## 2022-02-06 DIAGNOSIS — R4189 Other symptoms and signs involving cognitive functions and awareness: Secondary | ICD-10-CM | POA: Diagnosis not present

## 2022-02-06 DIAGNOSIS — E0840 Diabetes mellitus due to underlying condition with diabetic neuropathy, unspecified: Secondary | ICD-10-CM | POA: Diagnosis not present

## 2022-02-06 DIAGNOSIS — I152 Hypertension secondary to endocrine disorders: Secondary | ICD-10-CM | POA: Diagnosis not present

## 2022-02-06 MED ORDER — INSULIN PEN NEEDLE 32G X 6 MM MISC: 3 refills | Status: DC

## 2022-02-06 NOTE — Patient Instructions (Signed)
In place of ice cream, try strawberry shakes in evening

## 2022-02-06 NOTE — Progress Notes (Signed)
Provider:  Jacalyn Lefevre, MD  Careteam: Patient Care Team: Frederica Kuster, MD as PCP - General (Family Medicine)  PLACE OF SERVICE:  [facility]  Advanced Directive information      No chief complaint on file.    HPI: Patient is a 88 y.o. female patient is here for medical management of chronic problems including diabetes and Alzheimer's.  Her daughter visits the patient 2 or 3 times a day and administers her insulin.  She brings with her a log of blood sugars and they are generally in the 2 and 300 range reviewing past months readings are of only been 2 in the evening that were 400.  Her A1c has come down from 12.2-8.7 during the past 4 months.  Current insulin dose is 26 units in a.m. and 16 units at night she has gained about 11 pounds.  She likes to eat ice cream at night for a snack even though it is low sugar. There has been no change in her mental status since last visit.  Review of Systems:  Review of Systems  Constitutional: Negative.   Respiratory: Negative.    Cardiovascular: Negative.   Genitourinary: Negative.   Musculoskeletal: Negative.   Neurological: Negative.   Psychiatric/Behavioral:  Positive for memory loss.   All other systems reviewed and are negative.   Past Medical History:  Diagnosis Date   Allergic rhinitis, cause unspecified    Chronic pancreatitis (HCC)    Dyspepsia and other specified disorders of function of stomach    GERD (gastroesophageal reflux disease)    Other constipation    Type II or unspecified type diabetes mellitus with ophthalmic manifestations, not stated as uncontrolled(250.50)    Type II or unspecified type diabetes mellitus without mention of complication, not stated as uncontrolled    Unspecified essential hypertension    Unspecified hemorrhoids without mention of complication    Past Surgical History:  Procedure Laterality Date   ABDOMINAL HYSTERECTOMY     CESAREAN SECTION     X two   Social History:    reports that she has never smoked. She has never used smokeless tobacco. She reports that she does not drink alcohol and does not use drugs.  Family History  Problem Relation Age of Onset   Hypertension Father    Colon cancer Sister        and Several Aunts   Diabetes Son     Medications: Patient's Medications  New Prescriptions   No medications on file  Previous Medications   ASPIRIN 81 MG TABLET    Take 81 mg by mouth daily.   CITALOPRAM (CELEXA) 20 MG TABLET    Take 1 tablet (20 mg total) by mouth daily.   DOCUSATE SODIUM (COLACE) 100 MG CAPSULE    Take 100 mg by mouth as needed.   HYDROXYPROPYL METHYLCELLULOSE (ISOPTO TEARS) 2.5 % OPHTHALMIC SOLUTION    Place 1 drop into both eyes as needed for dry eyes.    INSULIN PEN NEEDLE 32G X 6 MM MISC    Use to inject insulin twice daily. Dx:E08.40   LOSARTAN-HYDROCHLOROTHIAZIDE (HYZAAR) 100-12.5 MG PER TABLET    Take 1 tablet by mouth daily.    LUMIGAN 0.01 % SOLN    Place 1 drop into both eyes daily.   NOVOLOG MIX 70/30 FLEXPEN (70-30) 100 UNIT/ML FLEXPEN    Inject 8-10 Units into the skin 2 (two) times daily. Inject 8 units in the morning and 10 units in the evening  PIOGLITAZONE (ACTOS) 30 MG TABLET    Take 30 mg by mouth daily.    RANITIDINE (ZANTAC) 300 MG TABLET    take 1 tablet by mouth at bedtime (APPOINTMENT REQUIRED FOR REFILLS)  Modified Medications   No medications on file  Discontinued Medications   No medications on file    Physical Exam:  Vitals:   02/06/22 1342  Weight: 171 lb 3.2 oz (77.7 kg)  Height: 4\' 11"  (1.499 m)   Body mass index is 34.58 kg/m. Wt Readings from Last 3 Encounters:  02/06/22 171 lb 3.2 oz (77.7 kg)  06/27/21 160 lb 6.4 oz (72.8 kg)  04/25/21 167 lb 3.2 oz (75.8 kg)    Physical Exam Vitals and nursing note reviewed.  Constitutional:      Appearance: Normal appearance.  Cardiovascular:     Rate and Rhythm: Normal rate and regular rhythm.  Pulmonary:     Effort: Pulmonary effort is  normal.     Breath sounds: Normal breath sounds.  Abdominal:     General: Abdomen is flat. Bowel sounds are normal.     Palpations: Abdomen is soft.  Skin:    General: Skin is warm and dry.  Neurological:     General: No focal deficit present.     Mental Status: She is alert.     Comments: Oriented to person and place  Psychiatric:        Behavior: Behavior normal.     Labs reviewed: Basic Metabolic Panel: No results for input(s): "NA", "K", "CL", "CO2", "GLUCOSE", "BUN", "CREATININE", "CALCIUM", "MG", "PHOS", "TSH" in the last 8760 hours. Liver Function Tests: No results for input(s): "AST", "ALT", "ALKPHOS", "BILITOT", "PROT", "ALBUMIN" in the last 8760 hours. No results for input(s): "LIPASE", "AMYLASE" in the last 8760 hours. No results for input(s): "AMMONIA" in the last 8760 hours. CBC: No results for input(s): "WBC", "NEUTROABS", "HGB", "HCT", "MCV", "PLT" in the last 8760 hours. Lipid Panel: No results for input(s): "CHOL", "HDL", "LDLCALC", "TRIG", "CHOLHDL", "LDLDIRECT" in the last 8760 hours. TSH: No results for input(s): "TSH" in the last 8760 hours. A1C: Lab Results  Component Value Date   HGBA1C 12.2 (H) 06/27/2021     Assessment/Plan   1. Cognitive decline This is stable.  Patient continues to live alone but daughter checks on her several times a day  2. 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 Hypertension associated with diabetes (HCC) Pressure continues to be good at 132/86  08/25/2021, MD [facility] [phone]

## 2022-02-06 NOTE — Telephone Encounter (Signed)
Requested refill on Pen Needles.

## 2022-05-15 ENCOUNTER — Ambulatory Visit: Payer: Medicare (Managed Care) | Admitting: Family Medicine

## 2022-05-23 ENCOUNTER — Ambulatory Visit: Payer: Medicare (Managed Care) | Admitting: Family Medicine

## 2022-05-24 ENCOUNTER — Other Ambulatory Visit: Payer: Self-pay

## 2022-05-24 ENCOUNTER — Telehealth (INDEPENDENT_AMBULATORY_CARE_PROVIDER_SITE_OTHER): Payer: Medicare (Managed Care) | Admitting: Family

## 2022-05-24 ENCOUNTER — Encounter: Payer: Self-pay | Admitting: Family

## 2022-05-24 ENCOUNTER — Ambulatory Visit: Payer: Medicare (Managed Care) | Admitting: Family Medicine

## 2022-05-24 DIAGNOSIS — E1142 Type 2 diabetes mellitus with diabetic polyneuropathy: Secondary | ICD-10-CM

## 2022-05-24 DIAGNOSIS — E0842 Diabetes mellitus due to underlying condition with diabetic polyneuropathy: Secondary | ICD-10-CM

## 2022-05-24 DIAGNOSIS — Z794 Long term (current) use of insulin: Secondary | ICD-10-CM | POA: Diagnosis not present

## 2022-05-24 MED ORDER — FREESTYLE LIBRE 14 DAY SENSOR MISC: 1.0000 | Freq: Every day | 11 refills | Status: DC

## 2022-05-24 MED ORDER — FREESTYLE LIBRE 14 DAY READER DEVI: 1.0000 | Freq: Every day | 11 refills | Status: DC

## 2022-05-24 NOTE — Progress Notes (Signed)
Provider: Smith Potenza FNP-C  Frederica Kuster, MD  Patient Care Team: Frederica Kuster, MD as PCP - General [facility])  Extended Emergency Contact Information Primary Emergency Contact: Mcinerney,Herbert Address: [address]          [place], [place] [address] [place] of [place] Home Phone: [phone] Relation: Son Secondary Emergency Contact: Marshall,Angela Mobile Phone: [phone] Relation: Daughter  Code Status:  DNR Goals of care: Advanced Directive information    05/24/2022    1:54 PM  Advanced Directives  Type of Advance Directive Living will;Healthcare Power of Attorney  Copy of Healthcare Power of Attorney in Chart? No - copy requested  Would patient like information on creating a medical advance directive? No - Patient declined     Chief Complaint  Patient presents with   Acute Visit    patient want to discuss monitor for glucose     HPI:  Pt is a 88 y.o. female seen today for an acute visit to discuss monitor for glucose.Daughter provides HPI information.States has taken over from her brother caring for patient.States CBG have improved since she has changed patient's diet readings range in the 80's -170's with some readings in the 200's sometimes.snacks on Ensure and sometimes 2 peanut butter crackers.crackers tends to make her blood sugars high.Has tried fruits but sugars get high too.Discussed reducing the amount of fruit or crackers. she denies any symptoms of hypoglycemia.   Past Medical History:  Diagnosis Date   Allergic rhinitis, cause unspecified    Chronic pancreatitis (HCC)    Dyspepsia and other specified disorders of function of stomach    GERD (gastroesophageal reflux disease)    Other constipation    Type II or unspecified type diabetes mellitus with ophthalmic manifestations, not stated as uncontrolled(250.50)    Type II or unspecified type diabetes mellitus without mention of complication, not stated as uncontrolled     Unspecified essential hypertension    Unspecified hemorrhoids without mention of complication    Past Surgical History:  Procedure Laterality Date   ABDOMINAL HYSTERECTOMY     CESAREAN SECTION     X two      Outpatient Encounter Medications as of 05/24/2022  Medication Sig   aspirin 81 MG tablet Take 81 mg by mouth daily.   docusate sodium (COLACE) 100 MG capsule Take 100 mg by mouth as needed.   ergocalciferol (VITAMIN D2) 1.25 MG (50000 UT) capsule Take 50,000 Units by mouth once a week.   HUMULIN 70/30 KWIKPEN (70-30) 100 UNIT/ML KwikPen Inject 100 Units into the skin.   hydroxypropyl methylcellulose (ISOPTO TEARS) 2.5 % ophthalmic solution Place 1 drop into both eyes as needed for dry eyes.    Insulin Pen Needle 32G X 6 MM MISC Use to inject insulin twice daily. Dx:E08.40   losartan (COZAAR) 100 MG tablet Take 100 mg by mouth daily.   LUMIGAN 0.01 % SOLN Place 1 drop into both eyes daily.   pioglitazone (ACTOS) 30 MG tablet Take 30 mg by mouth daily.    ranitidine (ZANTAC) 300 MG tablet take 1 tablet by mouth at bedtime (APPOINTMENT REQUIRED FOR REFILLS) (Patient taking differently: Take 300 mg by mouth at bedtime as needed.)   [DISCONTINUED] losartan-hydrochlorothiazide (HYZAAR) 100-12.5 MG per tablet Take 1 tablet by mouth daily.    [DISCONTINUED] NOVOLOG MIX 70/30 FLEXPEN (70-30) 100 UNIT/ML FlexPen Inject 8-10 Units into the skin 2 (two) times daily. Inject 26 units in the morning and 16 units in the evening   citalopram (  CELEXA) 20 MG tablet Take 1 tablet (20 mg total) by mouth daily. (Patient not taking: Reported on 05/24/2022)   No facility-administered encounter medications on file as of 05/24/2022.    Review of Systems  Unable to perform ROS: Dementia (HPI information provided by patient's daughter)  Constitutional:  Negative for appetite change, chills, fatigue, fever and unexpected weight change.  HENT:  Negative for sinus pressure, sinus pain, sneezing, sore throat,  tinnitus and trouble swallowing.   Eyes:  Negative for pain, discharge, redness, itching and visual disturbance.  Respiratory:  Negative for cough, chest tightness, shortness of breath and wheezing.   Cardiovascular:  Negative for chest pain, palpitations and leg swelling.  Gastrointestinal:  Negative for abdominal distention, abdominal pain, blood in stool, constipation, diarrhea, nausea and vomiting.  Endocrine: Negative for cold intolerance, heat intolerance, polydipsia, polyphagia and polyuria.  Genitourinary:  Negative for difficulty urinating, dysuria, flank pain, frequency and urgency.  Skin:  Negative for color change, pallor and rash.  Neurological:  Negative for dizziness, light-headedness, numbness and headaches.    Immunization History  Administered Date(s) Administered   Fluad Quad(high Dose 65+) 04/25/2021   Influenza, High Dose Seasonal PF 04/11/2017, 04/28/2018, 05/11/2019   Influenza,inj,quad, With Preservative 05/11/2016   Tdap 02/29/2012   Pertinent  Health Maintenance Due  Topic Date Due   OPHTHALMOLOGY EXAM  Never done   HEMOGLOBIN A1C  12/25/2021   INFLUENZA VACCINE  01/23/2022   FOOT EXAM  04/25/2022   DEXA SCAN  Completed      01/10/2021    2:35 PM 03/21/2021    1:30 PM 06/27/2021    1:17 PM 02/06/2022    1:46 PM 05/24/2022    1:53 PM  Fall Risk  Falls in the past year? 0 0 0 0 0  Was there an injury with Fall? 0 0 0 0 0  Fall Risk Category Calculator 0 0 0 0 0  Fall Risk Category Low Low Low Low Low  Patient Fall Risk Level Low fall risk Low fall risk Low fall risk Low fall risk Low fall risk  Patient at Risk for Falls Due to No Fall Risks No Fall Risks History of fall(s) No Fall Risks   Fall risk Follow up Falls evaluation completed;Education provided;Falls prevention discussed Falls evaluation completed Education provided;Falls prevention discussed;Falls evaluation completed Falls evaluation completed Falls evaluation completed   Functional Status  Survey:    There were no vitals filed for this visit. There is no height or weight on file to calculate BMI. Physical Exam Constitutional:      General: She is not in acute distress.    Appearance: She is not ill-appearing.  Neurological:     Mental Status: She is alert.    Labs reviewed: No results for input(s): "NA", "K", "CL", "CO2", "GLUCOSE", "BUN", "CREATININE", "CALCIUM", "MG", "PHOS" in the last 8760 hours. No results for input(s): "AST", "ALT", "ALKPHOS", "BILITOT", "PROT", "ALBUMIN" in the last 8760 hours. No results for input(s): "WBC", "NEUTROABS", "HGB", "HCT", "MCV", "PLT" in the last 8760 hours. Lab Results  Component Value Date   TSH 3.870 01/02/2021   Lab Results  Component Value Date   HGBA1C 12.2 (H) 06/27/2021   No results found for: "CHOL", "HDL", "LDLCALC", "LDLDIRECT", "TRIG", "CHOLHDL"  Significant Diagnostic Results in last 30 days:  No results found.  Assessment/Plan   Diabetes mellitus due to underlying condition with diabetic polyneuropathy, with long-term current use of insulin (HCC) Lab Results  Component Value Date   HGBA1C 12.2 (H)  06/27/2021  Checking blood sugars daily  Request continous glucose monitor due to OA limited with checking blood sugars daily. - Continuous Blood Gluc Sensor (FREESTYLE LIBRE 14 DAY SENSOR) MISC; 1 Device by Does not apply route daily.  Dispense: 1 each; Refill: 11 - Continuous Blood Gluc Receiver (FREESTYLE LIBRE 14 DAY READER) DEVI; 1 Device by Does not apply route daily.  Dispense: 1 each; Refill: 11  Family/ staff Communication: Reviewed plan of care with patient verbalized understanding   Labs/tests ordered: None   Next Appointment: Return if symptoms worsen or fail to improve.  I connected with  Melina L Pendry on 05/24/22 by a video enabled telemedicine application and verified that I am speaking with the correct person using two identifiers.   I discussed the limitations of evaluation and management  by telemedicine. The patient expressed understanding and agreed to proceed.  Spent 12 minutes of face to face with patient  >50% time spent counseling; reviewing medical record; labs; and developing future plan of care.   Caesar Bookman, NP

## 2022-06-06 ENCOUNTER — Other Ambulatory Visit: Payer: Self-pay

## 2022-06-06 DIAGNOSIS — Z794 Long term (current) use of insulin: Secondary | ICD-10-CM

## 2022-06-06 MED ORDER — FREESTYLE LIBRE 14 DAY SENSOR MISC: 1.0000 | Freq: Every day | 11 refills | Status: DC

## 2022-06-06 MED ORDER — FREESTYLE LIBRE 14 DAY READER DEVI: 1.0000 | Freq: Every day | 11 refills | Status: DC

## 2022-06-27 ENCOUNTER — Other Ambulatory Visit: Payer: Self-pay

## 2022-06-27 MED ORDER — CITALOPRAM HYDROBROMIDE 20 MG PO TABS: 20.0000 mg | ORAL_TABLET | Freq: Every day | ORAL | 0 refills | Status: DC

## 2022-09-17 ENCOUNTER — Other Ambulatory Visit: Payer: Self-pay

## 2022-09-17 MED ORDER — CITALOPRAM HYDROBROMIDE 20 MG PO TABS: 20.0000 mg | ORAL_TABLET | Freq: Every day | ORAL | 0 refills | Status: DC

## 2022-11-23 ENCOUNTER — Other Ambulatory Visit: Payer: Self-pay

## 2022-11-23 MED ORDER — CITALOPRAM HYDROBROMIDE 20 MG PO TABS: 20.0000 mg | ORAL_TABLET | Freq: Every day | ORAL | 1 refills | Status: DC

## 2022-11-23 NOTE — Telephone Encounter (Signed)
Patient has request refill on medication Citalopram 20mg .

## 2022-12-19 ENCOUNTER — Encounter: Payer: Self-pay | Admitting: Family Medicine

## 2022-12-19 ENCOUNTER — Ambulatory Visit (INDEPENDENT_AMBULATORY_CARE_PROVIDER_SITE_OTHER): Payer: Medicare HMO | Admitting: Family Medicine

## 2022-12-19 VITALS — BP 129/78 | HR 74 | Temp 97.4°F | Resp 18 | Ht 62.0 in | Wt 164.2 lb

## 2022-12-19 DIAGNOSIS — I152 Hypertension secondary to endocrine disorders: Secondary | ICD-10-CM | POA: Diagnosis not present

## 2022-12-19 DIAGNOSIS — G301 Alzheimer's disease with late onset: Secondary | ICD-10-CM

## 2022-12-19 DIAGNOSIS — E1159 Type 2 diabetes mellitus with other circulatory complications: Secondary | ICD-10-CM

## 2022-12-19 DIAGNOSIS — E0840 Diabetes mellitus due to underlying condition with diabetic neuropathy, unspecified: Secondary | ICD-10-CM

## 2022-12-19 DIAGNOSIS — E1140 Type 2 diabetes mellitus with diabetic neuropathy, unspecified: Secondary | ICD-10-CM

## 2022-12-19 DIAGNOSIS — F0280 Dementia in other diseases classified elsewhere without behavioral disturbance: Secondary | ICD-10-CM

## 2022-12-19 NOTE — Progress Notes (Signed)
Provider:  Jacalyn Lefevre, MD  Careteam: Patient Care Team: Frederica Kuster, MD as PCP - General (Family Medicine)  PLACE OF SERVICE:  [facility]  Advanced Directive information Does Patient Have a Medical Advance Directive?: No, Would patient like information on creating a medical advance directive?: No - Patient declined    Chief Complaint  Patient presents with   Acute Visit    Swelling in legs when sitting for long periods and  elevated blood sugars     HPI: Patient is a 89 y.o. female here for medical management of chronic problems including elevated blood sugar and dependent edema. Patient has been sitting outside in the sun more recently with her legs not elevated.  Daughters who are very attentive have suggested she elevate legs when seated.  There is no associated shortness of breath. Recently her blood sugars have become much elevated from the 100s up to 300.  The only dietary change has been the addition of watermelon and this could be the only source of sugar.  She denies any increase in carbohydrates.  There is no sign or symptom of infection especially in the respiratory and urinary systems currently she takes Humulin 70/30.  I do note that she is on pioglitazone which may be contributing to the edema but this is not a new medicine for her. Her memory has not changed recently.  She sleeps well.  She has lost a few pounds but appetite is good.  There have been no falls and she uses no aids to ambulate  Review of Systems:  Review of Systems  Constitutional: Negative.   HENT: Negative.    Respiratory: Negative.    Cardiovascular:  Positive for leg swelling.  Gastrointestinal: Negative.   Skin: Negative.   Psychiatric/Behavioral:  Positive for memory loss.   All other systems reviewed and are negative.   Past Medical History:  Diagnosis Date   Allergic rhinitis, cause unspecified    Chronic pancreatitis (HCC)    Dyspepsia and other specified disorders of  function of stomach    GERD (gastroesophageal reflux disease)    Other constipation    Type II or unspecified type diabetes mellitus with ophthalmic manifestations, not stated as uncontrolled(250.50)    Type II or unspecified type diabetes mellitus without mention of complication, not stated as uncontrolled    Unspecified essential hypertension    Unspecified hemorrhoids without mention of complication    Past Surgical History:  Procedure Laterality Date   ABDOMINAL HYSTERECTOMY     CESAREAN SECTION     X two   Social History:   reports that she has never smoked. She has never used smokeless tobacco. She reports that she does not drink alcohol and does not use drugs.  Family History  Problem Relation Age of Onset   Hypertension Father    Colon cancer Sister        and Several Aunts   Diabetes Son     Medications: Patient's Medications  New Prescriptions   No medications on file  Previous Medications   ASPIRIN 81 MG TABLET    Take 81 mg by mouth daily.   CITALOPRAM (CELEXA) 20 MG TABLET    Take 1 tablet (20 mg total) by mouth daily.   CONTINUOUS BLOOD GLUC RECEIVER (FREESTYLE LIBRE 14 DAY READER) DEVI    1 Device by Does not apply route daily.   CONTINUOUS BLOOD GLUC SENSOR (FREESTYLE LIBRE 14 DAY SENSOR) MISC    1 Device by Does not  apply route daily.   DOCUSATE SODIUM (COLACE) 100 MG CAPSULE    Take 100 mg by mouth as needed.   ERGOCALCIFEROL (VITAMIN D2) 1.25 MG (50000 UT) CAPSULE    Take 50,000 Units by mouth once a week.   HUMULIN 70/30 KWIKPEN (70-30) 100 UNIT/ML KWIKPEN    Inject 100 Units into the skin.   HYDROXYPROPYL METHYLCELLULOSE (ISOPTO TEARS) 2.5 % OPHTHALMIC SOLUTION    Place 1 drop into both eyes as needed for dry eyes.    INSULIN PEN NEEDLE 32G X 6 MM MISC    Use to inject insulin twice daily. Dx:E08.40   LOSARTAN (COZAAR) 100 MG TABLET    Take 100 mg by mouth daily.   LUMIGAN 0.01 % SOLN    Place 1 drop into both eyes daily.   PIOGLITAZONE (ACTOS) 30 MG  TABLET    Take 30 mg by mouth daily.    RANITIDINE (ZANTAC) 300 MG TABLET    take 1 tablet by mouth at bedtime (APPOINTMENT REQUIRED FOR REFILLS)  Modified Medications   No medications on file  Discontinued Medications   No medications on file    Physical Exam:  Vitals:   12/19/22 1427  BP: 129/78  Pulse: 74  Resp: 18  Temp: (!) 97.4 F (36.3 C)  SpO2: 96%  Weight: 164 lb 3.2 oz (74.5 kg)  Height: 5\' 2"  (1.575 m)   Body mass index is 30.03 kg/m. Wt Readings from Last 3 Encounters:  12/19/22 164 lb 3.2 oz (74.5 kg)  02/06/22 171 lb 3.2 oz (77.7 kg)  06/27/21 160 lb 6.4 oz (72.8 kg)    Physical Exam Vitals and nursing note reviewed.  Constitutional:      Appearance: Normal appearance.  Cardiovascular:     Rate and Rhythm: Normal rate and regular rhythm.  Pulmonary:     Effort: Pulmonary effort is normal.     Breath sounds: Normal breath sounds.  Abdominal:     General: Bowel sounds are normal.     Palpations: Abdomen is soft.  Musculoskeletal:     Right lower leg: Edema present.     Left lower leg: Edema present.     Comments: Ankles and lower legs are probably 2+ swollen  Neurological:     General: No focal deficit present.     Mental Status: She is alert and oriented to person, place, and time.     Labs reviewed: Basic Metabolic Panel: No results for input(s): "NA", "K", "CL", "CO2", "GLUCOSE", "BUN", "CREATININE", "CALCIUM", "MG", "PHOS", "TSH" in the last 8760 hours. Liver Function Tests: No results for input(s): "AST", "ALT", "ALKPHOS", "BILITOT", "PROT", "ALBUMIN" in the last 8760 hours. No results for input(s): "LIPASE", "AMYLASE" in the last 8760 hours. No results for input(s): "AMMONIA" in the last 8760 hours. CBC: No results for input(s): "WBC", "NEUTROABS", "HGB", "HCT", "MCV", "PLT" in the last 8760 hours. Lipid Panel: No results for input(s): "CHOL", "HDL", "LDLCALC", "TRIG", "CHOLHDL", "LDLDIRECT" in the last 8760 hours. TSH: No results for  input(s): "TSH" in the last 8760 hours. A1C: Lab Results  Component Value Date   HGBA1C 12.2 (H) 06/27/2021     Assessment/Plan  1. 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  In the meantime pay more attention to diet especially fruits like watermelon  2. Hypertension associated with diabetes (  HCC) Blood pressure today is 129/78.  Continue with losartan  3. Late onset Alzheimer's dementia without behavioral disturbance (HCC) I do not see any change in family concurs.  This seems to be less aggressive than was originally thought and may be more consistent with vascular dementia   Jacalyn Lefevre, MD [facility] [phone]

## 2023-02-01 ENCOUNTER — Encounter: Payer: Self-pay | Admitting: Family Medicine

## 2023-02-04 ENCOUNTER — Encounter: Payer: Self-pay | Admitting: Family Medicine

## 2023-02-05 ENCOUNTER — Encounter: Payer: Self-pay | Admitting: Family Medicine

## 2023-02-13 ENCOUNTER — Ambulatory Visit (INDEPENDENT_AMBULATORY_CARE_PROVIDER_SITE_OTHER): Payer: Medicare HMO | Admitting: Family Medicine

## 2023-02-13 ENCOUNTER — Encounter: Payer: Self-pay | Admitting: Family Medicine

## 2023-02-13 VITALS — BP 130/74 | HR 75 | Temp 97.1°F | Wt 162.0 lb

## 2023-02-13 DIAGNOSIS — G301 Alzheimer's disease with late onset: Secondary | ICD-10-CM

## 2023-02-13 DIAGNOSIS — F0280 Dementia in other diseases classified elsewhere without behavioral disturbance: Secondary | ICD-10-CM

## 2023-02-13 DIAGNOSIS — E0840 Diabetes mellitus due to underlying condition with diabetic neuropathy, unspecified: Secondary | ICD-10-CM

## 2023-02-13 DIAGNOSIS — R4189 Other symptoms and signs involving cognitive functions and awareness: Secondary | ICD-10-CM | POA: Diagnosis not present

## 2023-02-13 LAB — HM DIABETES EYE EXAM

## 2023-02-13 NOTE — Progress Notes (Signed)
Provider:  Jacalyn Lefevre, MD  Careteam: Patient Care Team: Frederica Kuster, MD as PCP - General (Family Medicine)  PLACE OF SERVICE:  [facility]  Advanced Directive information      Chief Complaint  Patient presents with   Medical Management of Chronic Issues    2 month follow up and foot exam. Discuss eye exam , Shinqrix, Pneumonia, Covid Vaccine, Tdap, AWV, Flu Vaccine.     HPI: Patient is a 89 y.o. female patient is here for medical management of chronic problems including diabetes, Alzheimer's dementia, hypertension. Her diabetes is managed by [facility] and [place].  Last A1c was 7.53 months ago currently she is taking NPH and regular insulin, Humulin 70/30 20 in the morning and 18 night.  They had tried her on metformin but she was unable to take it due to gastrointestinal symptoms. She continues to take losartan for her blood pressure.  She is not on any medicine for dementia but does takes it citalopram 20 mg  Review of Systems:  Review of Systems  Constitutional: Negative.   HENT:  Positive for hearing loss.   Eyes: Negative.   Cardiovascular: Negative.   Gastrointestinal: Negative.   Genitourinary: Negative.   Neurological: Negative.   Endo/Heme/Allergies: Negative.   Psychiatric/Behavioral:  Positive for memory loss.     Past Medical History:  Diagnosis Date   Allergic rhinitis, cause unspecified    Chronic pancreatitis (HCC)    Dyspepsia and other specified disorders of function of stomach    GERD (gastroesophageal reflux disease)    Other constipation    Type II or unspecified type diabetes mellitus with ophthalmic manifestations, not stated as uncontrolled(250.50)    Type II or unspecified type diabetes mellitus without mention of complication, not stated as uncontrolled    Unspecified essential hypertension    Unspecified hemorrhoids without mention of complication    Past Surgical History:  Procedure Laterality Date   ABDOMINAL  HYSTERECTOMY     CESAREAN SECTION     X two   Social History:   reports that she has never smoked. She has never used smokeless tobacco. She reports that she does not drink alcohol and does not use drugs.  Family History  Problem Relation Age of Onset   Hypertension Father    Colon cancer Sister        and Several Aunts   Diabetes Son     Medications: Patient's Medications  New Prescriptions   No medications on file  Previous Medications   ASPIRIN 81 MG TABLET    Take 81 mg by mouth daily.   CITALOPRAM (CELEXA) 20 MG TABLET    Take 1 tablet (20 mg total) by mouth daily.   CONTINUOUS BLOOD GLUC RECEIVER (FREESTYLE LIBRE 14 DAY READER) DEVI    1 Device by Does not apply route daily.   CONTINUOUS BLOOD GLUC SENSOR (FREESTYLE LIBRE 14 DAY SENSOR) MISC    1 Device by Does not apply route daily.   DOCUSATE SODIUM (COLACE) 100 MG CAPSULE    Take 100 mg by mouth as needed.   ERGOCALCIFEROL (VITAMIN D2) 1.25 MG (50000 UT) CAPSULE    Take 50,000 Units by mouth once a week.   HUMULIN 70/30 KWIKPEN (70-30) 100 UNIT/ML KWIKPEN    Inject 100 Units into the skin.   HYDROXYPROPYL METHYLCELLULOSE (ISOPTO TEARS) 2.5 % OPHTHALMIC SOLUTION    Place 1 drop into both eyes as needed for dry eyes.    INSULIN PEN NEEDLE 32G X  6 MM MISC    Use to inject insulin twice daily. Dx:E08.40   LOSARTAN (COZAAR) 100 MG TABLET    Take 100 mg by mouth daily.   LUMIGAN 0.01 % SOLN    Place 1 drop into both eyes daily.   PIOGLITAZONE (ACTOS) 30 MG TABLET    Take 30 mg by mouth daily.    RANITIDINE (ZANTAC) 300 MG TABLET    take 1 tablet by mouth at bedtime (APPOINTMENT REQUIRED FOR REFILLS)  Modified Medications   No medications on file  Discontinued Medications   No medications on file    Physical Exam:  There were no vitals filed for this visit. There is no height or weight on file to calculate BMI. Wt Readings from Last 3 Encounters:  12/19/22 164 lb 3.2 oz (74.5 kg)  02/06/22 171 lb 3.2 oz (77.7 kg)   06/27/21 160 lb 6.4 oz (72.8 kg)    Physical Exam Vitals and nursing note reviewed.  Constitutional:      Appearance: Normal appearance.  HENT:     Head: Normocephalic.  Cardiovascular:     Rate and Rhythm: Normal rate and regular rhythm.  Pulmonary:     Effort: Pulmonary effort is normal.     Breath sounds: Normal breath sounds.  Neurological:     General: No focal deficit present.     Mental Status: She is alert.     Comments: Oriented to person and time  Psychiatric:        Mood and Affect: Mood normal.        Behavior: Behavior normal.     Labs reviewed: Basic Metabolic Panel: No results for input(s): "NA", "K", "CL", "CO2", "GLUCOSE", "BUN", "CREATININE", "CALCIUM", "MG", "PHOS", "TSH" in the last 8760 hours. Liver Function Tests: No results for input(s): "AST", "ALT", "ALKPHOS", "BILITOT", "PROT", "ALBUMIN" in the last 8760 hours. No results for input(s): "LIPASE", "AMYLASE" in the last 8760 hours. No results for input(s): "AMMONIA" in the last 8760 hours. CBC: No results for input(s): "WBC", "NEUTROABS", "HGB", "HCT", "MCV", "PLT" in the last 8760 hours. Lipid Panel: No results for input(s): "CHOL", "HDL", "LDLCALC", "TRIG", "CHOLHDL", "LDLDIRECT" in the last 8760 hours. TSH: No results for input(s): "TSH" in the last 8760 hours. A1C: Lab Results  Component Value Date   HGBA1C 12.2 (H) 06/27/2021     Assessment/Plan  1. Cognitive decline No change since last visit.  On no medicines  2. 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  Currently taking Humulin 70/30 20 units in a.m. and 18 units in p.m.  3. Late onset Alzheimer's dementia without behavioral disturbance (HCC) Has seen neurologist but not in several years.  No significant behavioral changes   Jacalyn Lefevre, MD [facility] [phone]

## 2023-03-04 ENCOUNTER — Other Ambulatory Visit: Payer: Self-pay | Admitting: *Deleted

## 2023-03-04 MED ORDER — CITALOPRAM HYDROBROMIDE 20 MG PO TABS: 20.0000 mg | ORAL_TABLET | Freq: Every day | ORAL | 1 refills | Status: DC

## 2023-03-04 NOTE — Telephone Encounter (Signed)
[facility] [address].

## 2023-06-11 ENCOUNTER — Ambulatory Visit: Payer: Medicare HMO | Admitting: Sports Medicine

## 2023-07-03 ENCOUNTER — Ambulatory Visit: Payer: Medicare HMO | Admitting: Sports Medicine

## 2023-07-17 ENCOUNTER — Ambulatory Visit (INDEPENDENT_AMBULATORY_CARE_PROVIDER_SITE_OTHER): Payer: Medicare HMO | Admitting: Sports Medicine

## 2023-07-17 ENCOUNTER — Encounter: Payer: Self-pay | Admitting: Sports Medicine

## 2023-07-17 VITALS — BP 134/68 | HR 80 | Temp 97.2°F | Resp 17 | Ht 62.0 in | Wt 163.6 lb

## 2023-07-17 DIAGNOSIS — I152 Hypertension secondary to endocrine disorders: Secondary | ICD-10-CM

## 2023-07-17 DIAGNOSIS — E1142 Type 2 diabetes mellitus with diabetic polyneuropathy: Secondary | ICD-10-CM

## 2023-07-17 DIAGNOSIS — E1159 Type 2 diabetes mellitus with other circulatory complications: Secondary | ICD-10-CM

## 2023-07-17 DIAGNOSIS — Z780 Asymptomatic menopausal state: Secondary | ICD-10-CM

## 2023-07-17 DIAGNOSIS — F0390 Unspecified dementia without behavioral disturbance: Secondary | ICD-10-CM

## 2023-07-17 DIAGNOSIS — Z23 Encounter for immunization: Secondary | ICD-10-CM | POA: Diagnosis not present

## 2023-07-17 DIAGNOSIS — E782 Mixed hyperlipidemia: Secondary | ICD-10-CM | POA: Diagnosis not present

## 2023-07-17 DIAGNOSIS — F411 Generalized anxiety disorder: Secondary | ICD-10-CM

## 2023-07-17 DIAGNOSIS — I10 Essential (primary) hypertension: Secondary | ICD-10-CM

## 2023-07-17 DIAGNOSIS — E0840 Diabetes mellitus due to underlying condition with diabetic neuropathy, unspecified: Secondary | ICD-10-CM

## 2023-07-17 DIAGNOSIS — R5383 Other fatigue: Secondary | ICD-10-CM

## 2023-07-17 MED ORDER — HUMULIN 70/30 KWIKPEN (70-30) 100 UNIT/ML ~~LOC~~ SUPN: 16.0000 [IU] | PEN_INJECTOR | Freq: Two times a day (BID) | SUBCUTANEOUS | 3 refills | Status: DC

## 2023-07-17 NOTE — Progress Notes (Signed)
Careteam: Patient Care Team: Venita Sheffield, MD as PCP - General (Internal Medicine)  PLACE OF SERVICE:  [facility]IN[facility]  Advanced Directive information      Chief Complaint  Patient presents with   Medical Management of Chronic Issues    Routine Visit.    Immunizations    Discuss the need for DTAP vaccine, Influenza vaccine, Pne vaccine, Shingrix vaccine, and Covid Booster.    Health Maintenance    Discuss the need for Hemoglobin A1C, AWV, Foot exam, and Eye exam.     Discussed the use of AI scribe software for clinical note transcription with the patient, who gave verbal consent to proceed.  History of Present Illness   Dementia, has been experiencing memory problems for over three years. She lives with a nephew and receives daily visits from another family member who assists with medication management. The patient is largely independent, able to perform most daily activities, including personal hygiene and dressing, but does not cook. She has a good appetite and sleeps well, with no recent falls or mood disturbances reported.  The patient's diabetes is managed with daily insulin (Humulin 70/30 mix) and pioglitazone (Actos).  She has a known allergy to fluvastatin. The insulin dosage is adjusted based on daily blood sugar readings, which typically range from the 90s to 130s. The patient also takes losartan for hypertension and a baby aspirin daily. She was previously on citalopram (Celexa) for anxiety .  The patient had cataract surgery a month ago and has not seen a foot doctor in some time. She wears Depends for urinary incontinence but denies any urinary pain. She also denies any respiratory symptoms, abdominal pain, or joint pain.      The patient does not exercise regularly but does engage in yard work. She has not received a flu shot for the current season. The patient has not had a bone density test recently.       Review of Systems:  Review of Systems   Constitutional:  Negative for chills and fever.  HENT:  Negative for congestion and sore throat.   Eyes:  Negative for double vision.  Respiratory:  Negative for cough, sputum production and shortness of breath.   Cardiovascular:  Negative for chest pain, palpitations and leg swelling.  Gastrointestinal:  Negative for abdominal pain, heartburn and nausea.  Genitourinary:  Negative for dysuria, frequency and hematuria.  Musculoskeletal:  Negative for falls and myalgias.  Neurological:  Negative for dizziness, sensory change and focal weakness.  Psychiatric/Behavioral:  Positive for memory loss.    Negative unless indicated in HPI.   Past Medical History:  Diagnosis Date   Allergic rhinitis, cause unspecified    Chronic pancreatitis (HCC)    Dyspepsia and other specified disorders of function of stomach    GERD (gastroesophageal reflux disease)    Other constipation    Type II or unspecified type diabetes mellitus with ophthalmic manifestations, not stated as uncontrolled(250.50)    Type II or unspecified type diabetes mellitus without mention of complication, not stated as uncontrolled    Unspecified essential hypertension    Unspecified hemorrhoids without mention of complication    Past Surgical History:  Procedure Laterality Date   ABDOMINAL HYSTERECTOMY     CESAREAN SECTION     X two   Social History:   reports that she has never smoked. She has never used smokeless tobacco. She reports that she does not drink alcohol and does not use drugs.  Family History  Problem Relation  Age of Onset   Hypertension Father    Colon cancer Sister        and Several Aunts   Diabetes Son     Medications: Patient's Medications  New Prescriptions   No medications on file  Previous Medications   ASPIRIN 81 MG TABLET    Take 81 mg by mouth daily.   CITALOPRAM (CELEXA) 20 MG TABLET    Take 1 tablet (20 mg total) by mouth daily.   CONTINUOUS BLOOD GLUC RECEIVER (FREESTYLE LIBRE 14 DAY  READER) DEVI    1 Device by Does not apply route daily.   CONTINUOUS BLOOD GLUC SENSOR (FREESTYLE LIBRE 14 DAY SENSOR) MISC    1 Device by Does not apply route daily.   DOCUSATE SODIUM (COLACE) 100 MG CAPSULE    Take 100 mg by mouth as needed.   ERGOCALCIFEROL (VITAMIN D2) 1.25 MG (50000 UT) CAPSULE    Take 50,000 Units by mouth once a week.   HUMULIN 70/30 KWIKPEN (70-30) 100 UNIT/ML KWIKPEN    Inject 100 Units into the skin.   HYDROXYPROPYL METHYLCELLULOSE (ISOPTO TEARS) 2.5 % OPHTHALMIC SOLUTION    Place 1 drop into both eyes as needed for dry eyes.    INSULIN PEN NEEDLE 32G X 6 MM MISC    Use to inject insulin twice daily. Dx:E08.40   LOSARTAN (COZAAR) 100 MG TABLET    Take 100 mg by mouth daily.   LUMIGAN 0.01 % SOLN    Place 1 drop into both eyes daily.   PIOGLITAZONE (ACTOS) 30 MG TABLET    Take 30 mg by mouth daily.    RANITIDINE HCL PO    Take 300 mg by mouth as needed.  Modified Medications   No medications on file  Discontinued Medications   No medications on file    Physical Exam: There were no vitals filed for this visit. There is no height or weight on file to calculate BMI. BP Readings from Last 3 Encounters:  02/13/23 130/74  12/19/22 129/78  02/06/22 132/86   Wt Readings from Last 3 Encounters:  02/13/23 162 lb (73.5 kg)  12/19/22 164 lb 3.2 oz (74.5 kg)  02/06/22 171 lb 3.2 oz (77.7 kg)    Physical Exam Constitutional:      Appearance: Normal appearance.  HENT:     Head: Normocephalic and atraumatic.  Cardiovascular:     Rate and Rhythm: Normal rate and regular rhythm.  Pulmonary:     Effort: Pulmonary effort is normal. No respiratory distress.     Breath sounds: Normal breath sounds. No wheezing.  Abdominal:     General: Bowel sounds are normal. There is no distension.     Tenderness: There is no abdominal tenderness. There is no guarding or rebound.     Comments:    Musculoskeletal:        General: No swelling or tenderness.  Skin:    General: Skin  is dry.  Neurological:     Mental Status: She is alert. Mental status is at baseline.     Sensory: No sensory deficit.     Motor: No weakness.     Labs reviewed: Basic Metabolic Panel: No results for input(s): "NA", "K", "CL", "CO2", "GLUCOSE", "BUN", "CREATININE", "CALCIUM", "MG", "PHOS", "TSH" in the last 8760 hours. Liver Function Tests: No results for input(s): "AST", "ALT", "ALKPHOS", "BILITOT", "PROT", "ALBUMIN" in the last 8760 hours. No results for input(s): "LIPASE", "AMYLASE" in the last 8760 hours. No results for input(s): "AMMONIA" in  the last 8760 hours. CBC: No results for input(s): "WBC", "NEUTROABS", "HGB", "HCT", "MCV", "PLT" in the last 8760 hours. Lipid Panel: No results for input(s): "CHOL", "HDL", "LDLCALC", "TRIG", "CHOLHDL", "LDLDIRECT" in the last 8760 hours. TSH: No results for input(s): "TSH" in the last 8760 hours. A1C: Lab Results  Component Value Date   HGBA1C 12.2 (H) 06/27/2021     Assessment/Plan Dementia Chronic memory problems for over 3 years. Independent in ADLs except for cooking. No agitation, hallucinations, or depressive symptoms. -Continue current management.  Hypertension Well controlled on Losartan 100mg  daily. -Continue Losartan 100mg  daily.  Type 2 Diabetes Mellitus On Pioglitazone 15mg  daily and Humulin 70/30 mix insulin. Blood sugars usually in the 130s in the morning, occasionally in the 90s. -Reduce Humulin 70/30 mix insulin to 16 units twice daily. -Continue Pioglitazone 15mg  daily. -Check blood work including diabetes control.  Hyperlipidemia Not currently on a statin due to reported allergy to Fluvastatin. -Order lipid panel. -   GAD  Cont with celexa  General Health Maintenance -Administer influenza vaccine today. -Order bone density test.  -Plan for pneumonia vaccine at next visit.  No follow-ups on file.:

## 2023-07-18 LAB — LIPID PANEL
Cholesterol: 148 mg/dL (ref <200)
HDL: 66 mg/dL (ref >=50)
LDL Cholesterol (Calc): 66 mg/dL
Non-HDL Cholesterol (Calc): 82 mg/dL (ref <130)
Total CHOL/HDL Ratio: 2.2 (calc) (ref <5.0)
Triglycerides: 80 mg/dL (ref <150)

## 2023-07-18 LAB — CBC
HCT: 44.8 % (ref 35.0–45.0)
Hemoglobin: 14.3 g/dL (ref 11.7–15.5)
MCH: 28 pg (ref 27.0–33.0)
MCHC: 31.9 g/dL — ABNORMAL LOW (ref 32.0–36.0)
MCV: 87.8 fL (ref 80.0–100.0)
MPV: 13 fL — ABNORMAL HIGH (ref 7.5–12.5)
Platelets: 184 10*3/uL (ref 140–400)
RBC: 5.1 10*6/uL (ref 3.80–5.10)
RDW: 11.9 % (ref 11.0–15.0)
WBC: 5.5 10*3/uL (ref 3.8–10.8)

## 2023-07-18 LAB — HEMOGLOBIN A1C
Hgb A1c MFr Bld: 8.4 %{Hb} — ABNORMAL HIGH (ref <5.7)
Mean Plasma Glucose: 194 mg/dL
eAG (mmol/L): 10.8 mmol/L

## 2023-07-18 LAB — COMPLETE METABOLIC PANEL WITH GFR
AG Ratio: 1.7 (calc) (ref 1.0–2.5)
ALT: 19 U/L (ref 6–29)
AST: 17 U/L (ref 10–35)
Albumin: 4 g/dL (ref 3.6–5.1)
Alkaline phosphatase (APISO): 83 U/L (ref 37–153)
BUN: 17 mg/dL (ref 7–25)
CO2: 27 mmol/L (ref 20–32)
Calcium: 10.3 mg/dL (ref 8.6–10.4)
Chloride: 105 mmol/L (ref 98–110)
Creat: 0.71 mg/dL (ref 0.60–0.95)
Globulin: 2.3 g/dL (ref 1.9–3.7)
Glucose, Bld: 222 mg/dL — ABNORMAL HIGH (ref 65–139)
Potassium: 4.2 mmol/L (ref 3.5–5.3)
Sodium: 142 mmol/L (ref 135–146)
Total Bilirubin: 0.6 mg/dL (ref 0.2–1.2)
Total Protein: 6.3 g/dL (ref 6.1–8.1)
eGFR: 82 mL/min/{1.73_m2} (ref >=60)

## 2023-07-22 ENCOUNTER — Encounter: Payer: Self-pay | Admitting: Endocrinology

## 2023-07-31 ENCOUNTER — Ambulatory Visit (INDEPENDENT_AMBULATORY_CARE_PROVIDER_SITE_OTHER): Payer: Medicare Other | Admitting: Sports Medicine

## 2023-07-31 ENCOUNTER — Telehealth: Payer: Self-pay

## 2023-07-31 ENCOUNTER — Encounter: Payer: Self-pay | Admitting: Sports Medicine

## 2023-07-31 VITALS — BP 138/78 | HR 83 | Temp 96.9°F | Resp 16 | Ht 62.0 in | Wt 165.0 lb

## 2023-07-31 DIAGNOSIS — E1142 Type 2 diabetes mellitus with diabetic polyneuropathy: Secondary | ICD-10-CM

## 2023-07-31 DIAGNOSIS — R059 Cough, unspecified: Secondary | ICD-10-CM | POA: Diagnosis not present

## 2023-07-31 DIAGNOSIS — R410 Disorientation, unspecified: Secondary | ICD-10-CM

## 2023-07-31 DIAGNOSIS — R443 Hallucinations, unspecified: Secondary | ICD-10-CM

## 2023-07-31 DIAGNOSIS — J111 Influenza due to unidentified influenza virus with other respiratory manifestations: Secondary | ICD-10-CM

## 2023-07-31 LAB — POCT INFLUENZA A/B
Influenza A, POC: POSITIVE — AB
Influenza B, POC: NEGATIVE

## 2023-07-31 LAB — GLUCOSE, POCT (MANUAL RESULT ENTRY): POC Glucose: 388 mg/dL — AB (ref 70–99)

## 2023-07-31 LAB — POC COVID19 BINAXNOW: SARS Coronavirus 2 Ag: NEGATIVE

## 2023-07-31 MED ORDER — BENZONATATE 200 MG PO CAPS: 200.0000 mg | ORAL_CAPSULE | Freq: Two times a day (BID) | ORAL | 0 refills | Status: DC | PRN

## 2023-07-31 MED ORDER — GUAIFENESIN-DM 100-10 MG/5ML PO SYRP: 5.0000 mL | ORAL_SOLUTION | Freq: Three times a day (TID) | ORAL | 0 refills | Status: DC | PRN

## 2023-07-31 MED ORDER — OSELTAMIVIR PHOSPHATE 75 MG PO CAPS: 75.0000 mg | ORAL_CAPSULE | Freq: Two times a day (BID) | ORAL | 0 refills | Status: DC

## 2023-07-31 NOTE — Telephone Encounter (Signed)
 Olivia Madden, MD  [facility]6 minutes ago (4:52 PM)    If insurance does not cover tessalon  , can take  otc cough medicine.    Olivia Madden, MD  Anderson Lyle POUR, CMA8 minutes ago (4:50 PM)    Tessalon  is for cough, She does not need inhalers. Other things she can try

## 2023-07-31 NOTE — Progress Notes (Signed)
 Careteam: Patient Care Team: Sherlynn Madden, MD as PCP - General (Internal Medicine) Octavia Bruckner, MD as Consulting Physician (Ophthalmology)  PLACE OF SERVICE:  [facility]  Advanced Directive information Does Patient Have a Medical Advance Directive?: No, Would patient like information on creating a medical advance directive?: No - Patient declined  Allergies  Allergen Reactions   Penicillins Other (See Comments)       *PT UNABLE TO RECALL REACTION TYPE.   If all of the above answers are NO, then may proceed with Cephalospori   Fluvastatin Other (See Comments)    Chief Complaint  Patient presents with   Acute Visit    Patient complains of being lethargic/disoriented since flu shot.      Discussed the use of AI scribe software for clinical note transcription with the patient, who gave verbal consent to proceed.  History of Present Illness   Olivia Roberts is an 89 year old female who presents with increased confusion and drowsiness. She is accompanied by her daughter.  For the past few days, she has experienced increased confusion and drowsiness. Her daughter describes her as being 'out of it', sleeping more than usual, and talking to herself, including seeing people and things that are not there. This behavior has been more frequent recently, although it has occurred in the past. She did not sleep well the night before yesterday, leading to excessive daytime sleepiness yesterday. Despite sleeping during the day, she remained groggy and not her usual self. No recent falls or changes in mood aside from the increased drowsiness and confusion.  Her blood sugar levels have been high, reaching the 300s yesterday and this morning. No burning pain with urination, and no blood in her urine or stool.  She has developed a cough over the past two to three days, producing light yellow phlegm. No chest pain, fast heartbeats, dizziness, or lightheadedness. No pain during  urination, nausea, sore throat, ear pain, or nasal congestion. Her appetite remains good, and she continues to eat and drink normally. Daughter reports that she was sick with upper respiratory symptoms.  Her daughter ensures that she takes her medications as prescribed, maintaining the same routine. She is not on any new medications that could cause drowsiness.         Review of Systems:  Review of Systems  Constitutional:  Negative for chills and fever.  HENT:  Negative for congestion and sore throat.   Eyes:  Negative for double vision.  Respiratory:  Positive for cough and sputum production. Negative for shortness of breath.   Cardiovascular:  Negative for chest pain, palpitations and leg swelling.  Gastrointestinal:  Negative for abdominal pain, heartburn and nausea.  Genitourinary:  Negative for dysuria, frequency and hematuria.  Musculoskeletal:  Negative for falls and myalgias.  Neurological:  Negative for dizziness, sensory change and focal weakness.   Negative unless indicated in HPI.   Past Medical History:  Diagnosis Date   Allergic rhinitis, cause unspecified    Chronic pancreatitis (HCC)    Dyspepsia and other specified disorders of function of stomach    GERD (gastroesophageal reflux disease)    Other constipation    Type II or unspecified type diabetes mellitus with ophthalmic manifestations, not stated as uncontrolled(250.50)    Type II or unspecified type diabetes mellitus without mention of complication, not stated as uncontrolled    Unspecified essential hypertension    Unspecified hemorrhoids without mention of complication    Past Surgical History:  Procedure Laterality Date  ABDOMINAL HYSTERECTOMY     CESAREAN SECTION     X two   Social History:   reports that she has never smoked. She has never used smokeless tobacco. She reports that she does not drink alcohol and does not use drugs.  Family History  Problem Relation Age of Onset   Hypertension  Father    Colon cancer Sister        and Several Aunts   Diabetes Son     Medications: Patient's Medications  New Prescriptions   BENZONATATE  (TESSALON ) 200 MG CAPSULE    Take 1 capsule (200 mg total) by mouth 2 (two) times daily as needed for cough.   OSELTAMIVIR  (TAMIFLU ) 75 MG CAPSULE    Take 1 capsule (75 mg total) by mouth 2 (two) times daily.  Previous Medications   ASPIRIN 81 MG TABLET    Take 81 mg by mouth daily.   CITALOPRAM  (CELEXA ) 20 MG TABLET    Take 1 tablet (20 mg total) by mouth daily.   DOCUSATE SODIUM (COLACE) 100 MG CAPSULE    Take 100 mg by mouth as needed.   HUMULIN  70/30 KWIKPEN (70-30) 100 UNIT/ML KWIKPEN    Inject 16 Units into the skin in the morning and at bedtime.   INSULIN  PEN NEEDLE 32G X 6 MM MISC    Use to inject insulin  twice daily. Dx:E08.40   LATANOPROST (XALATAN) 0.005 % OPHTHALMIC SOLUTION    Place 1 drop into both eyes at bedtime.   LOSARTAN  (COZAAR ) 100 MG TABLET    Take 100 mg by mouth daily.   PIOGLITAZONE  (ACTOS ) 15 MG TABLET    Take 15 mg by mouth daily.  Modified Medications   No medications on file  Discontinued Medications   No medications on file    Physical Exam: Vitals:   07/31/23 0956  BP: 138/78  Pulse: 83  Resp: 16  Temp: (!) 96.9 F (36.1 C)  SpO2: 94%  Weight: 165 lb (74.8 kg)  Height: 5' 2 (1.575 m)   Body mass index is 30.18 kg/m. BP Readings from Last 3 Encounters:  07/31/23 138/78  07/17/23 134/68  02/13/23 130/74   Wt Readings from Last 3 Encounters:  07/31/23 165 lb (74.8 kg)  07/17/23 163 lb 9.6 oz (74.2 kg)  02/13/23 162 lb (73.5 kg)    Physical Exam Constitutional:      Appearance: Normal appearance.  HENT:     Head: Normocephalic and atraumatic.  Cardiovascular:     Rate and Rhythm: Normal rate and regular rhythm.     Pulses: Normal pulses.     Heart sounds: Normal heart sounds.  Pulmonary:     Effort: No respiratory distress.     Breath sounds: No stridor. No wheezing or rales.      Comments: Coarse breath sounds Abdominal:     General: Bowel sounds are normal. There is no distension.     Palpations: Abdomen is soft.     Tenderness: There is no abdominal tenderness. There is no right CVA tenderness or guarding.  Musculoskeletal:        General: No swelling.  Neurological:     Mental Status: She is alert. Mental status is at baseline.     Comments: Alert , knows her name Can recognize her daughter      Labs reviewed: Basic Metabolic Panel: Recent Labs    07/17/23 1456  NA 142  K 4.2  CL 105  CO2 27  GLUCOSE 222*  BUN 17  CREATININE 0.71  CALCIUM 10.3   Liver Function Tests: Recent Labs    07/17/23 1456  AST 17  ALT 19  BILITOT 0.6  PROT 6.3   No results for input(s): LIPASE, AMYLASE in the last 8760 hours. No results for input(s): AMMONIA in the last 8760 hours. CBC: Recent Labs    07/17/23 1456  WBC 5.5  HGB 14.3  HCT 44.8  MCV 87.8  PLT 184   Lipid Panel: Recent Labs    07/17/23 1456  CHOL 148  HDL 66  LDLCALC 66  TRIG 80  CHOLHDL 2.2   TSH: No results for input(s): TSH in the last 8760 hours. A1C: Lab Results  Component Value Date   HGBA1C 8.4 (H) 07/17/2023    Assessment and Plan    Influenza A Positive flu test with symptoms of increased confusion, drowsiness, and cough for the past 2-3 days. No fever or respiratory distress. will send Tamiflu  and cough medicine to be sent to [facility]. -chest x-ray   - will check cbc, bmp  CBC With Differential/Platelet - Complete Metabolic Panel with eGFR - DG Chest 2 View; Future - benzonatate  (TESSALON ) 200 MG capsule; Take 1 capsule (200 mg total) by mouth 2 (two) times daily as needed for cough.  Dispense: 20 capsule; Refill: 0 - oseltamivir  (TAMIFLU ) 75 MG capsule; Take 1 capsule (75 mg total) by mouth 2 (two) times daily.  Dispense: 20 capsule; Refill: 0   Confusion  Increased confusion and drowsiness over the past week, with  hallucinations and talking to herself. No fever or other signs of infection. -Hold Celexa  (citalopram ) for a few days until patient returns to baseline. pt is alert, can recognize her daughter, oriented to time and place instructed daughter to monitor for worsening symptoms and if mentation worsens needs to go to ED  CBC With Differential/Platelet - Complete Metabolic Panel with eGFR  Lower Extremity Edema Noted on physical exam. -Advise patient to elevate legs and use compression stockings.       DM type 2 with diabetic peripheral neuropathy (HCC)  Bg 338 Will check bmp Avoid sweets and high calorie foods Monitor BG twice daily         Return has follow up appt in april.:

## 2023-07-31 NOTE — Telephone Encounter (Signed)
 Patient pharmacy called with 3 inhaler alternatives for patient that is cheaper than the Benzonate. They gave the option for Breztri inhaler, acetylcysteine inhaler or Symbicort inhaler because they are all covered with insurance.patient has been notified

## 2023-07-31 NOTE — Telephone Encounter (Signed)
 Called and spoke to patient caregiver. She states that she will get prescription for Tamiflu  and find something over the counter since Tessalon  isnt covered by insurance. She will contact us  back if she has any other issues.

## 2023-08-01 ENCOUNTER — Other Ambulatory Visit: Payer: Self-pay | Admitting: Sports Medicine

## 2023-08-01 ENCOUNTER — Other Ambulatory Visit: Payer: Self-pay

## 2023-08-01 DIAGNOSIS — E871 Hypo-osmolality and hyponatremia: Secondary | ICD-10-CM

## 2023-08-01 LAB — CBC WITH DIFFERENTIAL/PLATELET
Absolute Lymphocytes: 762 {cells}/uL — ABNORMAL LOW (ref 850–3900)
Absolute Monocytes: 573 {cells}/uL (ref 200–950)
Basophils Absolute: 0 {cells}/uL (ref 0–200)
Basophils Relative: 0 %
Eosinophils Absolute: 0 {cells}/uL — ABNORMAL LOW (ref 15–500)
Eosinophils Relative: 0 %
HCT: 42 % (ref 35.0–45.0)
Hemoglobin: 13.2 g/dL (ref 11.7–15.5)
MCH: 27.7 pg (ref 27.0–33.0)
MCHC: 31.4 g/dL — ABNORMAL LOW (ref 32.0–36.0)
MCV: 88.1 fL (ref 80.0–100.0)
MPV: 13.4 fL — ABNORMAL HIGH (ref 7.5–12.5)
Monocytes Relative: 9.1 %
Neutro Abs: 4964 {cells}/uL (ref 1500–7800)
Neutrophils Relative %: 78.8 %
Platelets: 133 10*3/uL — ABNORMAL LOW (ref 140–400)
RBC: 4.77 10*6/uL (ref 3.80–5.10)
RDW: 12.1 % (ref 11.0–15.0)
Total Lymphocyte: 12.1 %
WBC: 6.3 10*3/uL (ref 3.8–10.8)

## 2023-08-01 LAB — COMPLETE METABOLIC PANEL WITH GFR
AG Ratio: 1.4 (calc) (ref 1.0–2.5)
ALT: 16 U/L (ref 6–29)
AST: 22 U/L (ref 10–35)
Albumin: 3.7 g/dL (ref 3.6–5.1)
Alkaline phosphatase (APISO): 76 U/L (ref 37–153)
BUN: 16 mg/dL (ref 7–25)
CO2: 26 mmol/L (ref 20–32)
Calcium: 9.6 mg/dL (ref 8.6–10.4)
Chloride: 98 mmol/L (ref 98–110)
Creat: 0.76 mg/dL (ref 0.60–0.95)
Globulin: 2.7 g/dL (ref 1.9–3.7)
Glucose, Bld: 391 mg/dL — ABNORMAL HIGH (ref 65–139)
Potassium: 3.9 mmol/L (ref 3.5–5.3)
Sodium: 133 mmol/L — ABNORMAL LOW (ref 135–146)
Total Bilirubin: 0.8 mg/dL (ref 0.2–1.2)
Total Protein: 6.4 g/dL (ref 6.1–8.1)
eGFR: 75 mL/min/{1.73_m2} (ref >=60)

## 2023-08-13 ENCOUNTER — Other Ambulatory Visit: Payer: Medicare Other

## 2023-08-13 DIAGNOSIS — E871 Hypo-osmolality and hyponatremia: Secondary | ICD-10-CM | POA: Diagnosis not present

## 2023-08-14 LAB — BASIC METABOLIC PANEL WITH GFR
BUN: 16 mg/dL (ref 7–25)
CO2: 25 mmol/L (ref 20–32)
Calcium: 10.7 mg/dL — ABNORMAL HIGH (ref 8.6–10.4)
Chloride: 108 mmol/L (ref 98–110)
Creat: 0.71 mg/dL (ref 0.60–0.95)
Glucose, Bld: 192 mg/dL — ABNORMAL HIGH (ref 65–139)
Potassium: 4.3 mmol/L (ref 3.5–5.3)
Sodium: 144 mmol/L (ref 135–146)
eGFR: 82 mL/min/{1.73_m2} (ref >=60)

## 2023-09-29 ENCOUNTER — Emergency Department (HOSPITAL_BASED_OUTPATIENT_CLINIC_OR_DEPARTMENT_OTHER)
Admission: EM | Admit: 2023-09-29 | Discharge: 2023-09-29 | Disposition: A | Discharge status: Home / Self Care | Attending: Emergency Medicine | Admitting: Emergency Medicine

## 2023-09-29 ENCOUNTER — Other Ambulatory Visit: Payer: Self-pay

## 2023-09-29 ENCOUNTER — Encounter (HOSPITAL_BASED_OUTPATIENT_CLINIC_OR_DEPARTMENT_OTHER): Payer: Self-pay | Admitting: Emergency Medicine

## 2023-09-29 DIAGNOSIS — Z79899 Other long term (current) drug therapy: Secondary | ICD-10-CM | POA: Insufficient documentation

## 2023-09-29 DIAGNOSIS — Z7982 Long term (current) use of aspirin: Secondary | ICD-10-CM | POA: Diagnosis not present

## 2023-09-29 DIAGNOSIS — I10 Essential (primary) hypertension: Secondary | ICD-10-CM | POA: Diagnosis not present

## 2023-09-29 DIAGNOSIS — R410 Disorientation, unspecified: Secondary | ICD-10-CM | POA: Diagnosis not present

## 2023-09-29 DIAGNOSIS — E119 Type 2 diabetes mellitus without complications: Secondary | ICD-10-CM | POA: Diagnosis not present

## 2023-09-29 DIAGNOSIS — R531 Weakness: Secondary | ICD-10-CM | POA: Diagnosis present

## 2023-09-29 DIAGNOSIS — F0390 Unspecified dementia without behavioral disturbance: Secondary | ICD-10-CM | POA: Diagnosis not present

## 2023-09-29 DIAGNOSIS — N390 Urinary tract infection, site not specified: Secondary | ICD-10-CM | POA: Insufficient documentation

## 2023-09-29 DIAGNOSIS — Z794 Long term (current) use of insulin: Secondary | ICD-10-CM | POA: Insufficient documentation

## 2023-09-29 LAB — CBC WITH DIFFERENTIAL/PLATELET
Abs Immature Granulocytes: 0.01 10*3/uL (ref 0.00–0.07)
Basophils Absolute: 0 10*3/uL (ref 0.0–0.1)
Basophils Relative: 0 %
Eosinophils Absolute: 0.1 10*3/uL (ref 0.0–0.5)
Eosinophils Relative: 1 %
HCT: 44.7 % (ref 36.0–46.0)
Hemoglobin: 14.1 g/dL (ref 12.0–15.0)
Immature Granulocytes: 0 %
Lymphocytes Relative: 30 %
Lymphs Abs: 1.4 10*3/uL (ref 0.7–4.0)
MCH: 28.1 pg (ref 26.0–34.0)
MCHC: 31.5 g/dL (ref 30.0–36.0)
MCV: 89 fL (ref 80.0–100.0)
Monocytes Absolute: 0.3 10*3/uL (ref 0.1–1.0)
Monocytes Relative: 6 %
Neutro Abs: 3 10*3/uL (ref 1.7–7.7)
Neutrophils Relative %: 63 %
Platelets: 148 10*3/uL — ABNORMAL LOW (ref 150–400)
RBC: 5.02 MIL/uL (ref 3.87–5.11)
RDW: 14.1 % (ref 11.5–15.5)
WBC: 4.7 10*3/uL (ref 4.0–10.5)
nRBC: 0 % (ref 0.0–0.2)

## 2023-09-29 LAB — COMPREHENSIVE METABOLIC PANEL WITH GFR
ALT: 29 U/L (ref 0–44)
AST: 29 U/L (ref 15–41)
Albumin: 3.8 g/dL (ref 3.5–5.0)
Alkaline Phosphatase: 81 U/L (ref 38–126)
Anion gap: 8 (ref 5–15)
BUN: 14 mg/dL (ref 8–23)
CO2: 25 mmol/L (ref 22–32)
Calcium: 10.1 mg/dL (ref 8.9–10.3)
Chloride: 105 mmol/L (ref 98–111)
Creatinine, Ser: 0.77 mg/dL (ref 0.44–1.00)
GFR, Estimated: >60 mL/min (ref >60)
Glucose, Bld: 284 mg/dL — ABNORMAL HIGH (ref 70–99)
Potassium: 3.9 mmol/L (ref 3.5–5.1)
Sodium: 138 mmol/L (ref 135–145)
Total Bilirubin: 0.5 mg/dL (ref 0.0–1.2)
Total Protein: 7.1 g/dL (ref 6.5–8.1)

## 2023-09-29 LAB — URINALYSIS, ROUTINE W REFLEX MICROSCOPIC
Bilirubin Urine: NEGATIVE
Glucose, UA: >=500 mg/dL — AB
Hgb urine dipstick: NEGATIVE
Ketones, ur: NEGATIVE mg/dL
Nitrite: NEGATIVE
Protein, ur: NEGATIVE mg/dL
Specific Gravity, Urine: 1.02 (ref 1.005–1.030)
pH: 7 (ref 5.0–8.0)

## 2023-09-29 LAB — URINALYSIS, MICROSCOPIC (REFLEX)

## 2023-09-29 LAB — RESP PANEL BY RT-PCR (RSV, FLU A&B, COVID)  RVPGX2
Influenza A by PCR: NEGATIVE
Influenza B by PCR: NEGATIVE
Resp Syncytial Virus by PCR: NEGATIVE
SARS Coronavirus 2 by RT PCR: NEGATIVE

## 2023-09-29 LAB — CBG MONITORING, ED: Glucose-Capillary: 276 mg/dL — ABNORMAL HIGH (ref 70–99)

## 2023-09-29 MED ORDER — CEPHALEXIN 500 MG PO CAPS: 500.0000 mg | ORAL_CAPSULE | Freq: Four times a day (QID) | ORAL | 0 refills | Status: DC

## 2023-09-29 MED ORDER — CEPHALEXIN 250 MG PO CAPS
500.0000 mg | ORAL_CAPSULE | Freq: Once | ORAL | Status: AC
  Administered 2023-09-29: 500 mg via ORAL
  Filled 2023-09-29: qty 2

## 2023-09-29 NOTE — ED Notes (Signed)
 IV attempt x2 unable to thread vein, unable to get any blood return for labs.  RN & Consulting civil engineer aware.  Patient tolerated well.

## 2023-09-29 NOTE — ED Provider Notes (Signed)
 [facility] Provider Note   CSN: [id number] Arrival date & time: 09/29/23  1517     History  Chief Complaint  Patient presents with   Weakness    Olivia Roberts is a 89 y.o. female.   Weakness Patient presents with generalized weakness confusion.  Has history of dementia.  Reportedly ate okay today but has not been feeling well.  Patient does not recognize her daughter and cannot provide too much history.  Reportedly told family member she felt as if she was going to die.    Past Medical History:  Diagnosis Date   Allergic rhinitis, cause unspecified    Chronic pancreatitis (HCC)    Dyspepsia and other specified disorders of function of stomach    GERD (gastroesophageal reflux disease)    Other constipation    Type II or unspecified type diabetes mellitus with ophthalmic manifestations, not stated as uncontrolled(250.50)    Type II or unspecified type diabetes mellitus without mention of complication, not stated as uncontrolled    Unspecified essential hypertension    Unspecified hemorrhoids without mention of complication     Home Medications Prior to Admission medications   Medication Sig Start Date End Date Taking? Authorizing Provider  cephALEXin (KEFLEX) 500 MG capsule Take 1 capsule (500 mg total) by mouth 4 (four) times daily. 09/29/23  Yes Benjiman Core, MD  aspirin 81 MG tablet Take 81 mg by mouth daily.    [provider]  benzonatate (TESSALON) 200 MG capsule Take 1 capsule (200 mg total) by mouth 2 (two) times daily as needed for cough. 07/31/23   Venita Sheffield, MD  citalopram (CELEXA) 20 MG tablet Take 1 tablet (20 mg total) by mouth daily. 03/04/23   Venita Sheffield, MD  docusate sodium (COLACE) 100 MG capsule Take 100 mg by mouth as needed.    [provider]  guaiFENesin-dextromethorphan (ROBITUSSIN DM) 100-10 MG/5ML syrup Take 5 mLs by mouth every 8 (eight) hours as needed for  cough. 07/31/23   Venita Sheffield, MD  HUMULIN 70/30 KWIKPEN (70-30) 100 UNIT/ML KwikPen Inject 16 Units into the skin in the morning and at bedtime. 07/17/23   Venita Sheffield, MD  Insulin Pen Needle 32G X 6 MM MISC Use to inject insulin twice daily. Dx:E08.40 02/06/22   Frederica Kuster, MD  latanoprost (XALATAN) 0.005 % ophthalmic solution Place 1 drop into both eyes at bedtime.    [provider]  losartan (COZAAR) 100 MG tablet Take 100 mg by mouth daily.    [provider]  oseltamivir (TAMIFLU) 75 MG capsule Take 1 capsule (75 mg total) by mouth 2 (two) times daily. 07/31/23   Venita Sheffield, MD  pioglitazone (ACTOS) 15 MG tablet Take 15 mg by mouth daily.    [provider]      Allergies    Penicillins and Fluvastatin    Review of Systems   Review of Systems  Neurological:  Positive for weakness.    Physical Exam Updated Vital Signs BP (!) 166/82   Pulse 76   Temp 97.9 F (36.6 C) (Oral)   Resp (!) 23   Wt 70.8 kg   SpO2 100%   BMI 28.53 kg/m  Physical Exam Vitals and nursing note reviewed.  HENT:     Head: Normocephalic and atraumatic.  Cardiovascular:     Rate and Rhythm: Regular rhythm.  Pulmonary:     Breath sounds: No wheezing.  Abdominal:     Tenderness: There  is no abdominal tenderness.  Neurological:     Mental Status: She is alert.     Comments: Awake and pleasant.  Does have confusion.  Moves all extremities.     ED Results / Procedures / Treatments   Labs (all labs ordered are listed, but only abnormal results are displayed) Labs Reviewed  COMPREHENSIVE METABOLIC PANEL WITH GFR - Abnormal; Notable for the following components:      Result Value   Glucose, Bld 284 (*)    All other components within normal limits  CBC WITH DIFFERENTIAL/PLATELET - Abnormal; Notable for the following components:   Platelets 148 (*)    All other components within normal limits  URINALYSIS, ROUTINE W REFLEX MICROSCOPIC -  Abnormal; Notable for the following components:   APPearance CLOUDY (*)    Glucose, UA >=500 (*)    Leukocytes,Ua SMALL (*)    All other components within normal limits  URINALYSIS, MICROSCOPIC (REFLEX) - Abnormal; Notable for the following components:   Bacteria, UA MANY (*)    All other components within normal limits  CBG MONITORING, ED - Abnormal; Notable for the following components:   Glucose-Capillary 276 (*)    All other components within normal limits  RESP PANEL BY RT-PCR (RSV, FLU A&B, COVID)  [id number]  URINE CULTURE    EKG EKG Interpretation Date/Time:  "Sunday September 29 2023 15:49:02 EDT Ventricular Rate:  81 PR Interval:  166 QRS Duration:  88 QT Interval:  381 QTC Calculation: 443 R Axis:   38  Text Interpretation: Sinus rhythm Low voltage, precordial leads Confirmed by Nole Robey ([id number]) on 09/29/2023 3:57:02 PM  Radiology No results found.  Procedures Procedures    Medications Ordered in ED Medications  cephALEXin (KEFLEX) capsule 500 mg (500 mg Oral Given 09/29/23 1838)    ED Course/ Medical Decision Making/ A&P                                 Medical Decision Making Amount and/or Complexity of Data Reviewed Labs: ordered.  Risk Prescription drug management.   Patient brought in for mental status change.  Reportedly feeling bad.  Differential diagnoses long includes causes such as dehydration, UTI, infection.  No trauma and intracranial hemorrhage felt less likely.  Blood work reassuring.  Urinalysis shows likely UTI.  Culture sent.  Think this is likely the cause of the encephalopathy.  Patient cannot tell if she has dysuria.  Discussed with patient's daughter who is at bedside and thinks patient can manage at home with her.  Will discharge.  Patient has reported penicillin allergy but reportedly years ago and was mild.  Will treat with Keflex.        Final Clinical Impression(s) / ED Diagnoses Final diagnoses:  Lower urinary tract  infectious disease    Rx / DC Orders ED Discharge Orders          Ordered    cephALEXin (KEFLEX) 500 MG capsule  4 times daily        04" /06/25 Mayer Camel, MD 09/29/23 1849

## 2023-09-29 NOTE — ED Notes (Signed)
Attempted IV/labs x1, unsuccessful.

## 2023-09-29 NOTE — ED Triage Notes (Addendum)
 Pt has hx of dementia, oriented to person, dtr reports pt ate and drank normally today but was c/o "not feeling well"; pt unable to verbalize what is wrong; pt aggressive and yelling at triage RN; the only thing pt said is that she feels weak and "like I am going to die"

## 2023-10-01 LAB — URINE CULTURE: Culture: >=100000 — AB

## 2023-10-02 ENCOUNTER — Telehealth (HOSPITAL_BASED_OUTPATIENT_CLINIC_OR_DEPARTMENT_OTHER): Payer: Self-pay | Admitting: Emergency Medicine

## 2023-10-02 NOTE — Telephone Encounter (Signed)
 Post ED Visit - Positive Culture Follow-up  Culture report reviewed by antimicrobial stewardship pharmacist: [facility] []  Enzo Bi, Pharm.D. []  Celedonio Miyamoto, Pharm.D., BCPS AQ-ID []  Garvin Fila, Pharm.D., BCPS []  Georgina Pillion, [address].D., BCPS []  [place], [place].D., BCPS, AAHIVP []  Estella Husk, Pharm.D., BCPS, AAHIVP []  Lysle Pearl, PharmD, BCPS []  Phillips Climes, PharmD, BCPS []  Agapito Games, PharmD, BCPS [x]  Lora Paula, PharmD []  Mervyn Gay, PharmD, BCPS []  Vinnie Level, PharmD  [facility] []  Len Childs, PharmD []  Greer Pickerel, PharmD []  Adalberto Cole, PharmD []  Perlie Gold, Rph []  Lonell Face) Jean Rosenthal, PharmD []  Earl Many, PharmD []  Junita Push, PharmD []  Dorna Leitz, PharmD []  Terrilee Files, PharmD []  Lynann Beaver, PharmD []  Keturah Barre, PharmD []  Loralee Pacas, PharmD []  Bernadene Person, PharmD   Positive urine culture Treated with Cephalexin, organism sensitive to the same and no further patient follow-up is required at this time.  Tanna Savoy Marvyn Torrez 10/02/2023, 3:26 PM

## 2023-10-16 ENCOUNTER — Encounter: Payer: Self-pay | Admitting: Sports Medicine

## 2023-10-16 ENCOUNTER — Ambulatory Visit (INDEPENDENT_AMBULATORY_CARE_PROVIDER_SITE_OTHER): Payer: Medicare HMO | Admitting: Sports Medicine

## 2023-10-16 VITALS — BP 140/74 | HR 77 | Temp 97.6°F | Resp 19 | Ht 62.0 in | Wt 158.8 lb

## 2023-10-16 DIAGNOSIS — K5900 Constipation, unspecified: Secondary | ICD-10-CM

## 2023-10-16 DIAGNOSIS — F0390 Unspecified dementia without behavioral disturbance: Secondary | ICD-10-CM | POA: Diagnosis not present

## 2023-10-16 DIAGNOSIS — E1142 Type 2 diabetes mellitus with diabetic polyneuropathy: Secondary | ICD-10-CM

## 2023-10-16 DIAGNOSIS — I10 Essential (primary) hypertension: Secondary | ICD-10-CM | POA: Diagnosis not present

## 2023-10-16 NOTE — Progress Notes (Signed)
 Careteam: Patient Care Team: Tye Gall, MD as PCP - General (Internal Medicine) Devin Foerster, MD as Consulting Physician (Ophthalmology)  PLACE OF SERVICE:  [facility]  Advanced Directive information Does Patient Have a Medical Advance Directive?: No, Would patient like information on creating a medical advance directive?: No - Patient declined  Allergies  Allergen Reactions   Penicillins Other (See Comments)        Fluvastatin Other (See Comments)    Chief Complaint  Patient presents with   Medical Management of Chronic Issues    3 month follow up.     Discussed the use of AI scribe software for clinical note transcription with the patient, who gave verbal consent to proceed.  History of Present Illness  Olivia Roberts is an 89 year old female who presents for a follow-up visit after a recent emergency room visit for a urinary tract infection. She is accompanied by her daughter.  She recently visited the emergency room for a urinary tract infection and currently has no symptoms of dysuria, hematuria, or urgency. She does not use incontinence products and is able to reach the bathroom on time.  She has a history of diabetes mellitus and her daughter assists with insulin  administration. No current symptoms of peripheral neuropathy such as tingling or numbness in her feet. Currently on humulin  70/30 mix 16 units twice daily.    No recent falls, joint pains, dizziness, lightheadedness, or mood changes such as depression or lack of interest in activities. She does not feel dizzy when standing up.  She maintains her independence in personal care activities such as bathing and dressing. She does not use a cane for walking and reports no joint pain or congestion.  No cough, congestion, or diarrhea and she has regular bowel movements daily.    Review of Systems:  Review of Systems  Constitutional:  Negative for chills and fever.  HENT:  Negative for  congestion and sore throat.   Eyes:  Negative for double vision.  Respiratory:  Negative for cough, sputum production and shortness of breath.   Cardiovascular:  Negative for chest pain, palpitations and leg swelling.  Gastrointestinal:  Negative for abdominal pain, heartburn and nausea.  Genitourinary:  Negative for dysuria, frequency and hematuria.  Musculoskeletal:  Negative for falls and joint pain.  Neurological:  Negative for dizziness.  Psychiatric/Behavioral:  Negative for depression.    Negative unless indicated in HPI.   Past Medical History:  Diagnosis Date   Allergic rhinitis, cause unspecified    Chronic pancreatitis (HCC)    Dyspepsia and other specified disorders of function of stomach    GERD (gastroesophageal reflux disease)    Other constipation    Type II or unspecified type diabetes mellitus with ophthalmic manifestations, not stated as uncontrolled(250.50)    Type II or unspecified type diabetes mellitus without mention of complication, not stated as uncontrolled    Unspecified essential hypertension    Unspecified hemorrhoids without mention of complication    Past Surgical History:  Procedure Laterality Date   ABDOMINAL HYSTERECTOMY     CESAREAN SECTION     X two   Social History:   reports that she has never smoked. She has never used smokeless tobacco. She reports that she does not drink alcohol and does not use drugs.  Family History  Problem Relation Age of Onset   Hypertension Father    Colon cancer Sister        and Several Aunts   Diabetes Son  Medications: Patient's Medications  New Prescriptions   No medications on file  Previous Medications   ASPIRIN 81 MG TABLET    Take 81 mg by mouth daily.   DOCUSATE SODIUM (COLACE) 100 MG CAPSULE    Take 100 mg by mouth as needed.   HUMULIN  70/30 KWIKPEN (70-30) 100 UNIT/ML KWIKPEN    Inject 16 Units into the skin in the morning and at bedtime.   INSULIN  PEN NEEDLE 32G X 6 MM MISC    Use to  inject insulin  twice daily. Dx:E08.40   LATANOPROST (XALATAN) 0.005 % OPHTHALMIC SOLUTION    Place 1 drop into both eyes at bedtime.   LOSARTAN (COZAAR) 100 MG TABLET    Take 100 mg by mouth daily.   PIOGLITAZONE (ACTOS) 15 MG TABLET    Take 15 mg by mouth daily.  Modified Medications   No medications on file  Discontinued Medications   BENZONATATE  (TESSALON ) 200 MG CAPSULE    Take 1 capsule (200 mg total) by mouth 2 (two) times daily as needed for cough.   CEPHALEXIN  (KEFLEX ) 500 MG CAPSULE    Take 1 capsule (500 mg total) by mouth 4 (four) times daily.   CITALOPRAM  (CELEXA ) 20 MG TABLET    Take 1 tablet (20 mg total) by mouth daily.   GUAIFENESIN -DEXTROMETHORPHAN (ROBITUSSIN DM) 100-10 MG/5ML SYRUP    Take 5 mLs by mouth every 8 (eight) hours as needed for cough.   OSELTAMIVIR  (TAMIFLU ) 75 MG CAPSULE    Take 1 capsule (75 mg total) by mouth 2 (two) times daily.    Physical Exam: Vitals:   10/16/23 1328 10/16/23 1407  BP: (!) 150/76 (!) 140/74  Pulse: 77   Resp: 19   Temp: 97.6 F (36.4 C)   SpO2: 99%   Weight: 158 lb 12.8 oz (72 kg)   Height: 5\' 2"  (1.575 m)    Body mass index is 29.04 kg/m. BP Readings from Last 3 Encounters:  10/16/23 (!) 140/74  09/29/23 (!) 166/82  07/31/23 138/78   Wt Readings from Last 3 Encounters:  10/16/23 158 lb 12.8 oz (72 kg)  09/29/23 156 lb (70.8 kg)  07/31/23 165 lb (74.8 kg)    Physical Exam Constitutional:      Appearance: Normal appearance.  HENT:     Head: Normocephalic and atraumatic.  Cardiovascular:     Rate and Rhythm: Normal rate and regular rhythm.  Pulmonary:     Effort: Pulmonary effort is normal. No respiratory distress.     Breath sounds: Normal breath sounds. No wheezing.  Abdominal:     General: Bowel sounds are normal. There is no distension.     Tenderness: There is no abdominal tenderness. There is no guarding or rebound.     Comments:    Musculoskeletal:        General: No swelling.  Neurological:     Mental  Status: She is alert. Mental status is at baseline.     Labs reviewed: Basic Metabolic Panel: Recent Labs    07/31/23 1101 08/13/23 1353 09/29/23 1700  NA 133* 144 138  K 3.9 4.3 3.9  CL 98 108 105  CO2 26 25 25   GLUCOSE 391* 192* 284*  BUN 16 16 14   CREATININE 0.76 0.71 0.77  CALCIUM 9.6 10.7* 10.1   Liver Function Tests: Recent Labs    07/17/23 1456 07/31/23 1101 09/29/23 1700  AST 17 22 29   ALT 19 16 29   ALKPHOS  --   --  81  BILITOT 0.6 0.8 0.5  PROT 6.3 6.4 7.1  ALBUMIN  --   --  3.8   No results for input(s): "LIPASE", "AMYLASE" in the last 8760 hours. No results for input(s): "AMMONIA" in the last 8760 hours. CBC: Recent Labs    07/17/23 1456 07/31/23 1101 09/29/23 1700  WBC 5.5 6.3 4.7  NEUTROABS  --  4,964 3.0  HGB 14.3 13.2 14.1  HCT 44.8 42.0 44.7  MCV 87.8 88.1 89.0  PLT 184 133* 148*   Lipid Panel: Recent Labs    07/17/23 1456  CHOL 148  HDL 66  LDLCALC 66  TRIG 80  CHOLHDL 2.2   TSH: No results for input(s): "TSH" in the last 8760 hours. A1C: Lab Results  Component Value Date   HGBA1C 8.4 (H) 07/17/2023    Assessment and Plan Assessment & Plan 1. DM type 2 with diabetic peripheral neuropathy (HCC) (Primary) Will check A1c  Will adjust insulin  accordingly Instructed patient to limit sweets and high carbohydrate foods Check BG twice daily and keep a log    2. Major neurocognitive disorder (HCC) Pt has supportive family Daughter helps with medication management No hallucinations or agitation as per family  No recent falls Instructed patient to increase cognitively engaging activities  3. Primary hypertension High today Instructed to avoid salty foods Cont with losartan  4. Constipation, unspecified constipation type Increase oral hydration Take colace prn

## 2023-10-17 LAB — HEMOGLOBIN A1C
Hgb A1c MFr Bld: 8.8 % — ABNORMAL HIGH (ref <5.7)
Mean Plasma Glucose: 206 mg/dL
eAG (mmol/L): 11.4 mmol/L

## 2023-10-18 ENCOUNTER — Other Ambulatory Visit: Payer: Self-pay | Admitting: Sports Medicine

## 2023-10-18 MED ORDER — PIOGLITAZONE HCL 45 MG PO TABS
45.0000 mg | ORAL_TABLET | Freq: Every day | ORAL | 2 refills | Status: DC
Start: 2023-10-18 — End: 2023-10-25

## 2023-10-21 ENCOUNTER — Other Ambulatory Visit: Payer: Self-pay

## 2023-10-22 NOTE — Telephone Encounter (Signed)
-----   Message from [facility] [place] D sent at 10/21/2023 11:25 AM EDT ----- Called patient and lab results were discussed and understood with daughter Shelvy Dickens.Patient daughter states that pharmacy needs to be [facility] and not [facility]. Medication is pending for approval. She states that you mentioned her starting Citalopram  20mg . She's requesting that you refill this medication as well. Because she mentioned that her mother has memory issues. Are there any natural/herbal supplements she can take for Dementia? Message routed to PCP Tye Gall, MD

## 2023-10-23 NOTE — Telephone Encounter (Signed)
 Patient daughter called and notified. She states that her mother is not depressed and she will monitor her.

## 2023-10-23 NOTE — Telephone Encounter (Signed)
 Tye Gall, MD  [facility]17 hours ago (3:25 PM)    Plz call the daughter and inform that there are no herbal medicines to improve memory. Pt denies being depressed to me during the visit, would recommend monitoring for depression and if she Exhibits any signs of depression to let me know, thanks.

## 2023-10-25 ENCOUNTER — Telehealth

## 2023-10-25 MED ORDER — PIOGLITAZONE HCL 45 MG PO TABS: 45.0000 mg | ORAL_TABLET | Freq: Every day | ORAL | 2 refills | Status: DC

## 2023-10-25 NOTE — Telephone Encounter (Signed)
 Sent to correct pharmacy

## 2023-10-25 NOTE — Telephone Encounter (Signed)
 Copied from CRM [id number]. Topic: Clinical - Prescription Issue >> Oct 25, 2023  1:02 PM Arlie Benedict B wrote: Reason for CRM: [facility] [address], [place] - [address]  Phone: [phone] Fax: [phone]  Patient's daughter Shelvy Dickens called in stating the the prescription pioglitazone  (ACTOS ) 45 MG tablet was sent over to the incorrect pharmacy on 10/18/2023, and is needing to be resent to the correct pharmacy which is listed above. She states her mother will no longer use [facility]

## 2023-10-30 ENCOUNTER — Other Ambulatory Visit: Payer: Self-pay

## 2023-10-30 MED ORDER — LOSARTAN POTASSIUM 100 MG PO TABS: 100.0000 mg | ORAL_TABLET | Freq: Every day | ORAL | 1 refills | Status: DC

## 2023-11-11 ENCOUNTER — Ambulatory Visit (INDEPENDENT_AMBULATORY_CARE_PROVIDER_SITE_OTHER): Admitting: Adult Health

## 2023-11-11 ENCOUNTER — Telehealth: Payer: Self-pay | Admitting: *Deleted

## 2023-11-11 ENCOUNTER — Encounter: Payer: Self-pay | Admitting: Adult Health

## 2023-11-11 VITALS — BP 125/76 | HR 77 | Temp 97.0°F | Resp 18 | Ht 62.0 in | Wt 160.6 lb

## 2023-11-11 DIAGNOSIS — E1142 Type 2 diabetes mellitus with diabetic polyneuropathy: Secondary | ICD-10-CM

## 2023-11-11 DIAGNOSIS — R350 Frequency of micturition: Secondary | ICD-10-CM

## 2023-11-11 DIAGNOSIS — N390 Urinary tract infection, site not specified: Secondary | ICD-10-CM

## 2023-11-11 DIAGNOSIS — E1159 Type 2 diabetes mellitus with other circulatory complications: Secondary | ICD-10-CM | POA: Diagnosis not present

## 2023-11-11 DIAGNOSIS — I152 Hypertension secondary to endocrine disorders: Secondary | ICD-10-CM

## 2023-11-11 LAB — POCT URINALYSIS DIPSTICK (MANUAL)
Nitrite, UA: POSITIVE — AB
Poct Bilirubin: NEGATIVE
Poct Blood: 50 — AB
Poct Glucose: NORMAL mg/dL
Poct Ketones: NEGATIVE
Poct Protein: NEGATIVE mg/dL
Poct Urobilinogen: NORMAL mg/dL
Spec Grav, UA: <=1.005 — AB (ref 1.010–1.025)
pH, UA: 7.5 (ref 5.0–8.0)

## 2023-11-11 MED ORDER — SULFAMETHOXAZOLE-TRIMETHOPRIM 800-160 MG PO TABS: 1.0000 | ORAL_TABLET | Freq: Two times a day (BID) | ORAL | 0 refills | Status: AC

## 2023-11-11 NOTE — Telephone Encounter (Signed)
 Called daughter and scheduled an appointment to come in today to see Monina

## 2023-11-11 NOTE — Telephone Encounter (Signed)
 Copied from CRM [id number]. Topic: Clinical - Medication Question >> Oz Gammel 19, 2025  8:35 AM Shelby Dessert H wrote: Reason for CRM: Patients daughter called in [facility]) and thinks the patient has a UTI and wants to know if she can get a rx sent in for it or does she need to come in and be seen in order to get something, patients daughters callback number is [phone].

## 2023-11-11 NOTE — Progress Notes (Unsigned)
 [facility] clinic  Provider:  Inge Mangle DNP  Code Status:  Full Code  Goals of Care:     11/11/2023   10:38 AM  Advanced Directives  Does Patient Have a Medical Advance Directive? No  Would patient like information on creating a medical advance directive? No - Patient declined     Chief Complaint  Patient presents with   Urinary Tract Infection    Possible UTI    Discussed the use of AI scribe software for clinical note transcription with the patient, who gave verbal consent to proceed.  HPI: Patient is a 89 y.o. female seen today for an acute visit for possible UTI. She was accompanied by her daughter.  She has been experiencing increasing weakness, stating that she 'could hardly walk.' Her daughter notes that she has been using a walker at home but required a wheelchair to get to the car today due to her weakness. Despite walking daily at home, she feels 'real weak.'  She reports increased frequency of urination and a burning sensation during urination, which she noticed while showering last night. Her daughter mentions that she typically does not show symptoms when she has a urinary tract infection (UTI), but confusion and weakness are common indicators for her.  She has a history of hypertension and is currently taking Toprol XL 50 mg daily and losartan  25 mg daily. Her blood pressure was noted to be 156/106 last night, which is higher than usual, but decreased to 136 after taking metoprolol. Her blood pressure today is 132/86.  She has a history of stroke and deep vein thrombosis (DVT) in the right lower extremity, for which she takes Eliquis 2.5 mg twice a day.  She has a history of breast cancer, which necessitates blood pressure measurements on the opposite arm. Her sodium levels were checked last week and were 131, which is considered good for her.  She uses AYR saline nasal gel daily to manage nasal crusting and takes Tylenol as needed. She drinks Liquid IV for  hydration, as she cannot drink much water due to concerns about her sodium levels.  No current cough but has dysphagia. No swelling in the extremities is noted. She experiences frequent bowel movements, sometimes up to four times a day.   Past Medical History:  Diagnosis Date   Allergic rhinitis, cause unspecified    Chronic pancreatitis (HCC)    Dyspepsia and other specified disorders of function of stomach    GERD (gastroesophageal reflux disease)    Other constipation    Type II or unspecified type diabetes mellitus with ophthalmic manifestations, not stated as uncontrolled(250.50)    Type II or unspecified type diabetes mellitus without mention of complication, not stated as uncontrolled    Unspecified essential hypertension    Unspecified hemorrhoids without mention of complication     Past Surgical History:  Procedure Laterality Date   ABDOMINAL HYSTERECTOMY     CESAREAN SECTION     X two    Allergies  Allergen Reactions   Penicillins Other (See Comments)    Has patient had a PCN reaction causing immediate rash, facial/tongue/throat swelling, SOB or lightheadedness with hypotension: unknown Has patient had a PCN reaction causing severe rash involving mucus membranes or skin necrosis:  unknown Has patient had a PCN reaction that required hospitalization Has patient had a PCN reaction occurring within the last 10 years: unknown  PT UNABLE TO RECALL REACTION TYPE.   If all of the above answers are "NO", then may  proceed with Cephalospori   Fluvastatin Other (See Comments)    Outpatient Encounter Medications as of 11/11/2023  Medication Sig   aspirin 81 MG tablet Take 81 mg by mouth daily.   docusate sodium (COLACE) 100 MG capsule Take 100 mg by mouth as needed.   HUMULIN  70/30 KWIKPEN (70-30) 100 UNIT/ML KwikPen Inject 16 Units into the skin in the morning and at bedtime.   Insulin  Pen Needle 32G X 6 MM MISC Use to inject insulin  twice daily. Dx:E08.40   latanoprost  (XALATAN) 0.005 % ophthalmic solution Place 1 drop into both eyes at bedtime.   losartan  (COZAAR ) 100 MG tablet Take 1 tablet (100 mg total) by mouth daily.   pioglitazone  (ACTOS ) 45 MG tablet Take 1 tablet (45 mg total) by mouth daily.   sulfamethoxazole -trimethoprim  (BACTRIM  DS) 800-160 MG tablet Take 1 tablet by mouth 2 (two) times daily for 7 days.   No facility-administered encounter medications on file as of 11/11/2023.    Review of Systems:  Review of Systems  Constitutional:  Negative for appetite change, chills, fatigue and fever.  HENT:  Negative for congestion, hearing loss, rhinorrhea and sore throat.   Eyes: Negative.   Respiratory:  Negative for cough, shortness of breath and wheezing.   Cardiovascular:  Negative for chest pain, palpitations and leg swelling.  Gastrointestinal:  Negative for abdominal pain, constipation, diarrhea, nausea and vomiting.  Genitourinary:  Positive for frequency. Negative for dysuria.  Musculoskeletal:  Negative for arthralgias, back pain and myalgias.  Skin:  Negative for color change, rash and wound.  Neurological:  Positive for weakness. Negative for dizziness and headaches.  Psychiatric/Behavioral:  Negative for behavioral problems. The patient is not nervous/anxious.     Health Maintenance  Topic Date Due   Medicare Annual Wellness (AWV)  03/21/2022   COVID-19 Vaccine (3 - 2024-25 season) 02/24/2023   Zoster Vaccines- Shingrix (1 of 2) 01/15/2024 (Originally 02/12/1983)   DTaP/Tdap/Td (2 - Td or Tdap) 10/15/2024 (Originally 02/28/2022)   Pneumonia Vaccine 58+ Years old (1 of 2 - PCV) 10/15/2024 (Originally 02/12/1952)   INFLUENZA VACCINE  01/24/2024   HEMOGLOBIN A1C  04/16/2024   OPHTHALMOLOGY EXAM  05/27/2024   FOOT EXAM  07/16/2024   DEXA SCAN  Completed   HPV VACCINES  Aged Out   Meningococcal B Vaccine  Aged Out    Physical Exam: Vitals:   11/11/23 1042  BP: 125/76  Pulse: 77  Resp: 18  Temp: (!) 97 F (36.1 C)  SpO2: 99%   Weight: 160 lb 9.6 oz (72.8 kg)  Height: 5\' 2"  (1.575 m)   Body mass index is 29.37 kg/m. Physical Exam Constitutional:      General: She is not in acute distress. HENT:     Head: Normocephalic and atraumatic.     Nose: Nose normal.     Mouth/Throat:     Mouth: Mucous membranes are moist.  Eyes:     Conjunctiva/sclera: Conjunctivae normal.  Cardiovascular:     Rate and Rhythm: Normal rate and regular rhythm.  Pulmonary:     Effort: Pulmonary effort is normal.     Breath sounds: Normal breath sounds.  Abdominal:     General: Bowel sounds are normal.     Palpations: Abdomen is soft.  Musculoskeletal:        General: Normal range of motion.     Cervical back: Normal range of motion.  Skin:    General: Skin is warm and dry.  Neurological:  Mental Status: She is alert.  Psychiatric:        Mood and Affect: Mood normal.        Behavior: Behavior normal.     Labs reviewed: Basic Metabolic Panel: Recent Labs    07/31/23 1101 08/13/23 1353 09/29/23 1700  NA 133* 144 138  K 3.9 4.3 3.9  CL 98 108 105  CO2 26 25 25   GLUCOSE 391* 192* 284*  BUN 16 16 14   CREATININE 0.76 0.71 0.77  CALCIUM 9.6 10.7* 10.1   Liver Function Tests: Recent Labs    07/17/23 1456 07/31/23 1101 09/29/23 1700  AST 17 22 29   ALT 19 16 29   ALKPHOS  --   --  81  BILITOT 0.6 0.8 0.5  PROT 6.3 6.4 7.1  ALBUMIN  --   --  3.8   No results for input(s): "LIPASE", "AMYLASE" in the last 8760 hours. No results for input(s): "AMMONIA" in the last 8760 hours. CBC: Recent Labs    07/17/23 1456 07/31/23 1101 09/29/23 1700  WBC 5.5 6.3 4.7  NEUTROABS  --  4,964 3.0  HGB 14.3 13.2 14.1  HCT 44.8 42.0 44.7  MCV 87.8 88.1 89.0  PLT 184 133* 148*   Lipid Panel: Recent Labs    07/17/23 1456  CHOL 148  HDL 66  LDLCALC 66  TRIG 80  CHOLHDL 2.2   Lab Results  Component Value Date   HGBA1C 8.8 (H) 10/16/2023    Procedures since last visit: No results  found.  Assessment/Plan  1. Urinary tract infection without hematuria, site unspecified (Primary)  Urinary frequency -  Suspected UTI with hematuria, positive leukocytes, and nitrite on dipstick.  - Bactrim  DS 1 tab  BID for 7 days. - Sent urine for culture. - Encouraged increased water intake, considering sodium levels. - POCT Urinalysis Dip Manual - Culture, Urine - sulfamethoxazole -trimethoprim  (BACTRIM  DS) 800-160 MG tablet; Take 1 tablet by mouth 2 (two) times daily for 7 days.  Dispense: 14 tablet; Refill: 0  2. Type 2 diabetes mellitus with diabetic polyneuropathy, without long-term current use of insulin  (HCC) Lab Results  Component Value Date   HGBA1C 8.8 (H) 10/16/2023    -  continue Humulin  70/30  16 units twice a day -  continue Actos  45 mg daily  3. Hypertension associated with diabetes (HCC) -  Hypertension controlled with Toprol XL 50 mg and losartan  25 mg daily. BP 132/86 during visit.     Labs/tests ordered:   Urine dipstick and urine culture   Return if symptoms worsen or fail to improve.  Rossie Bretado Medina-Vargas, NP

## 2023-11-12 ENCOUNTER — Ambulatory Visit: Payer: Self-pay | Admitting: Adult Health

## 2023-11-12 NOTE — Progress Notes (Signed)
-     Discussed during the clinic visit, awaiting urine culture result.

## 2023-11-13 LAB — URINE CULTURE
MICRO NUMBER:: 16472952
SPECIMEN QUALITY:: ADEQUATE

## 2023-11-13 NOTE — Progress Notes (Signed)
-    Bacteria isolate was sensitive to  Bactrim  DS which was prescribed during the clinic visit on 11/11/23. Continue taking Bactrim  DS as prescribed.

## 2024-01-06 ENCOUNTER — Other Ambulatory Visit: Payer: Self-pay | Admitting: Sports Medicine

## 2024-01-06 DIAGNOSIS — E1142 Type 2 diabetes mellitus with diabetic polyneuropathy: Secondary | ICD-10-CM

## 2024-01-06 NOTE — Telephone Encounter (Unsigned)
 Copied from CRM [id number]. Topic: Clinical - Medication Refill >> Jan 06, 2024  2:43 PM Miquel SAILOR wrote: Medication: HUMULIN  70/30 KWIKPEN (70-30) 100 UNIT/ML KwikPen   Has the patient contacted their pharmacy? Yes (Agent: If no, request that the patient contact the pharmacy for the refill. If patient does not wish to contact the pharmacy document the reason why and proceed with request.) (Agent: If yes, when and what did the pharmacy advise?)  This is the patient's preferred pharmacy:  [facility] [address], [place] - [address] [place] [address] Phone: [phone] Fax: [phone]  Is this the correct pharmacy for this prescription? Yes If no, delete pharmacy and type the correct one.   Has the prescription been filled recently? Yes  Is the patient out of the medication? Yes  Has the patient been seen for an appointment in the last year OR does the patient have an upcoming appointment? Yes  Can we respond through MyChart? Yes  Agent: Please be advised that Rx refills may take up to 3 business days. We ask that you follow-up with your pharmacy.

## 2024-01-07 ENCOUNTER — Telehealth: Payer: Self-pay | Admitting: *Deleted

## 2024-01-07 ENCOUNTER — Other Ambulatory Visit: Payer: Self-pay | Admitting: Sports Medicine

## 2024-01-07 MED ORDER — RELION LANCETS ULTRA-THIN 30G MISC: 1 refills | Status: AC

## 2024-01-07 MED ORDER — HUMULIN 70/30 KWIKPEN (70-30) 100 UNIT/ML ~~LOC~~ SUPN: 16.0000 [IU] | PEN_INJECTOR | Freq: Two times a day (BID) | SUBCUTANEOUS | 3 refills | Status: DC

## 2024-01-07 MED ORDER — INSULIN PEN NEEDLE 32G X 6 MM MISC: 3 refills | Status: DC

## 2024-01-07 NOTE — Telephone Encounter (Signed)
 Copied from CRM [id number]. Topic: Clinical - Medication Refill >> Jan 07, 2024  2:25 PM Miquel SAILOR wrote: Medication: Insulin  Pen Needle 32G X 6 MM MISC [[id number]]   Has the patient contacted their pharmacy? Yes (Agent: If no, request that the patient contact the pharmacy for the refill. If patient does not wish to contact the pharmacy document the reason why and proceed with request.) (Agent: If yes, when and what did the pharmacy advise?)  This is the patient's preferred pharmacy:  [facility] [address], [place] - [address] [place] [address] Phone: [phone] Fax: [phone]  Is this the correct pharmacy for this prescription? Yes If no, delete pharmacy and type the correct one.   Has the prescription been filled recently? Yes  Is the patient out of the medication? Yes  Has the patient been seen for an appointment in the last year OR does the patient have an upcoming appointment? Yes  Can we respond through MyChart? Yes  Agent: Please be advised that Rx refills may take up to 3 business days. We ask that you follow-up with your pharmacy.

## 2024-01-07 NOTE — Telephone Encounter (Signed)
 Copied from CRM [id number]. Topic: Clinical - Medication Question >> Jan 07, 2024  2:28 PM Miquel SAILOR wrote: Reason for CRM: Relion meter lancets-Patient is requesting this due to out of medication. Needs call back when updated [phone]   Rx sent to pharmacy as requested. Tried calling patient. NA

## 2024-01-08 ENCOUNTER — Telehealth: Payer: Self-pay

## 2024-01-08 DIAGNOSIS — E0840 Diabetes mellitus due to underlying condition with diabetic neuropathy, unspecified: Secondary | ICD-10-CM

## 2024-01-08 MED ORDER — INSULIN PEN NEEDLE 31G X 5 MM MISC: 1.0000 | Freq: Two times a day (BID) | 11 refills | Status: DC

## 2024-01-08 MED ORDER — INSULIN PEN NEEDLE 31G X 5 MM MISC: 1.0000 | Freq: Two times a day (BID) | 11 refills | Status: AC

## 2024-01-08 NOTE — Telephone Encounter (Signed)
 I called [facility] to ask what size needle they have in stock, was told to hold prior to asking question and call was disconnected.   I them attempted x 2 to send over for a different size needle (electronically) and it would not go through.  Side note: We have no way of knowing which size needed they have in stock.   I called pharmacy, spoke with Charmaine . Charmaine states they do have the 32g x 6 , however patients insurance will not cover. Charmaine took a verbal on the 31g x 5 and stated she will substitute for whatever patients insurance covers   Left detailed message for patients daughter informing her alternative sent

## 2024-01-08 NOTE — Telephone Encounter (Signed)
 Copied from CRM [id number]. Topic: Clinical - Prescription Issue >> Jan 08, 2024 12:12 PM Adrianna P wrote: Reason for CRM: Pharmacy is out of Insulin  Pen Needle 32G X 6 MM MISC, patient needs a prescription for ones that are available at the pharmacy.

## 2024-02-25 ENCOUNTER — Ambulatory Visit: Payer: Self-pay

## 2024-02-25 NOTE — Telephone Encounter (Signed)
 FYI - Appt tomorrow afternoon with Sherlynn Madden, MD

## 2024-02-25 NOTE — Telephone Encounter (Signed)
 FYI Only or Action Required?: FYI only for provider.  Patient was last seen in primary care on 11/11/2023 by Medina-Vargas, Jereld BROCKS, NP.  Called Nurse Triage reporting Leg Swelling and Leg Pain.  Symptoms began several weeks ago.  Interventions attempted: Rest, hydration, or home remedies and Other: elevating legs.  Symptoms are: ebbs and flows.  Triage Disposition: See Physician Within 24 Hours  Patient/caregiver understands and will follow disposition?: Yes     Copied from CRM #[id number]. Topic: Clinical - Red Word Triage >> Feb 25, 2024 10:26 AM Miquel SAILOR wrote: Red Word that prompted transfer to Nurse Triage: Fluid in legs both/Diabetic/Swollen/Sore to touch-2 weeks When elevates feet slows down but still swollen then put down start swelling right away Reason for Disposition  [1] MODERATE leg swelling (e.g., swelling extends up to knees) AND [2] new-onset or getting worse  Answer Assessment - Initial Assessment Questions 1. ONSET: When did the swelling start? (e.g., minutes, hours, days)     2 weeks ago, 2-3 weeks now 2. LOCATION: What part of the leg is swollen?  Are both legs swollen or just one leg?     Just into back of calf down 3. SEVERITY: How bad is the swelling? (e.g., localized; mild, moderate, severe)     Equally swollen, tight to the touch and sore, pitting when legs down 4. REDNESS: Is there redness or signs of infection?     No redness, sores, weeping fluid, heat to area 5. PAIN: Is the swelling painful to touch? If Yes, ask: How painful is it?   (Scale 1-10; mild, moderate or severe)     Probably about 4-5/10 6. FEVER: Do you have a fever? If Yes, ask: What is it, how was it measured, and when did it start?      no 8. MEDICAL HISTORY: Do you have a history of blood clots (e.g., DVT), cancer, heart failure, kidney disease, or liver failure?     no 9. RECURRENT SYMPTOM: Have you had leg swelling before? If Yes, ask: When was the last time?  What happened that time?     Not really 10. OTHER SYMPTOMS: Do you have any other symptoms? (e.g., chest pain, difficulty breathing)       No chest pain or SOB Eats good, talks good, doing good except for them legs, walking fine  Elevates, will go back to normal, if not then swell   Advised pt be examined in next 24 hours, scheduled earliest available with PCP office for tomorrow afternoon, advised call back if worsening or new symptoms.  Protocols used: Leg Swelling and Edema-A-AH

## 2024-02-26 ENCOUNTER — Encounter: Payer: Self-pay | Admitting: Sports Medicine

## 2024-02-26 ENCOUNTER — Ambulatory Visit (INDEPENDENT_AMBULATORY_CARE_PROVIDER_SITE_OTHER): Admitting: Sports Medicine

## 2024-02-26 VITALS — BP 170/80 | HR 84 | Temp 98.5°F | Resp 14 | Ht 62.0 in | Wt 165.4 lb

## 2024-02-26 DIAGNOSIS — M7989 Other specified soft tissue disorders: Secondary | ICD-10-CM | POA: Diagnosis not present

## 2024-02-26 DIAGNOSIS — E1142 Type 2 diabetes mellitus with diabetic polyneuropathy: Secondary | ICD-10-CM | POA: Diagnosis not present

## 2024-02-26 DIAGNOSIS — I10 Essential (primary) hypertension: Secondary | ICD-10-CM

## 2024-02-26 DIAGNOSIS — R601 Generalized edema: Secondary | ICD-10-CM | POA: Diagnosis not present

## 2024-02-26 MED ORDER — FUROSEMIDE 40 MG PO TABS: 40.0000 mg | ORAL_TABLET | Freq: Every day | ORAL | 0 refills | Status: DC

## 2024-02-26 MED ORDER — HUMULIN 70/30 KWIKPEN (70-30) 100 UNIT/ML ~~LOC~~ SUPN: 20.0000 [IU] | PEN_INJECTOR | Freq: Two times a day (BID) | SUBCUTANEOUS | 3 refills | Status: DC

## 2024-02-26 MED ORDER — FUROSEMIDE 40 MG PO TABS
40.0000 mg | ORAL_TABLET | Freq: Every day | ORAL | 0 refills | Status: AC
Start: 2024-02-26 — End: ?

## 2024-02-26 NOTE — Progress Notes (Signed)
 Careteam: Patient Care Team: Sherlynn Madden, MD as PCP - General (Internal Medicine) Octavia Bruckner, MD as Consulting Physician (Ophthalmology)  PLACE OF SERVICE:  [facility]  Advanced Directive information    Allergies  Allergen Reactions   Penicillins Other (See Comments)    Has patient had a PCN reaction causing immediate rash, facial/tongue/throat swelling, SOB or lightheadedness with hypotension:   Has patient had a PCN reaction causing severe rash involving mucus membranes or skin necrosis:   Has patient had a PCN reaction that required hospitalization   Has patient had a PCN reaction occurring within the last 10 years   *PT UNABLE TO RECALL REACTION TYPE.   If all of the above answers are NO, then may proceed with Cephalospori   Fluvastatin Other (See Comments)    Chief Complaint  Patient presents with   Leg Swelling    Swelling and pain about 3 weeks.     Discussed the use of AI scribe software for clinical note transcription with the patient, who gave verbal consent to proceed.  History of Present Illness  Olivia Roberts is an 89 year old female who presents with leg swelling and soreness. She is accompanied by her daughter, who is also her primary caregiver.  She has been experiencing soreness and swelling in her legs for approximately three weeks. The swelling fluctuates, sometimes decreasing but then returning. She elevates her legs while sleeping in a recliner and uses a heating pad and alcohol with Vaseline for relief. She has not used compression stockings.  Her diet is high in sodium, consuming foods such as potato chips, TV dinners, and pot pies. Her daughter mentions that she has tried low sodium options, but she continues to consume high sodium foods.  No breathing difficulties or recent changes in her breathing have been noted. She sleeps in a recliner and has not noticed any significant changes in her breathing over the past few  weeks.  No stomach pain, urinary discomfort, or significant dizziness, although she reports feeling a little dizzy at times. Her blood pressure has been high.    Review of Systems:  Review of Systems  Constitutional:  Negative for chills and fever.  HENT:  Negative for congestion and sore throat.   Respiratory:  Negative for cough, sputum production and shortness of breath.   Cardiovascular:  Positive for leg swelling. Negative for chest pain and palpitations.  Gastrointestinal:  Negative for abdominal pain, heartburn and nausea.  Genitourinary:  Negative for dysuria, frequency and hematuria.  Musculoskeletal:  Negative for falls and myalgias.  Neurological:  Negative for dizziness.   Negative unless indicated in HPI.   Past Medical History:  Diagnosis Date   Allergic rhinitis, cause unspecified    Chronic pancreatitis (HCC)    Dyspepsia and other specified disorders of function of stomach    GERD (gastroesophageal reflux disease)    Other constipation    Type II or unspecified type diabetes mellitus with ophthalmic manifestations, not stated as uncontrolled(250.50)    Type II or unspecified type diabetes mellitus without mention of complication, not stated as uncontrolled    Unspecified essential hypertension    Unspecified hemorrhoids without mention of complication    Past Surgical History:  Procedure Laterality Date   ABDOMINAL HYSTERECTOMY     CESAREAN SECTION     X two   Social History:   reports that she has never smoked. She has never used smokeless tobacco. She reports that she does not drink alcohol and does  not use drugs.  Family History  Problem Relation Age of Onset   Hypertension Father    Colon cancer Sister        and Several Aunts   Diabetes Son     Medications: Patient's Medications  New Prescriptions   No medications on file  Previous Medications   ASPIRIN 81 MG TABLET    Take 81 mg by mouth daily.   DOCUSATE SODIUM (COLACE) 100 MG CAPSULE     Take 100 mg by mouth as needed.   HUMULIN  70/30 KWIKPEN (70-30) 100 UNIT/ML KWIKPEN    Inject 16 Units into the skin in the morning and at bedtime.   INSULIN  PEN NEEDLE 31G X 5 MM MISC    1 Device by Does not apply route 2 (two) times daily. E08.40   LATANOPROST (XALATAN) 0.005 % OPHTHALMIC SOLUTION    Place 1 drop into both eyes at bedtime.   LOSARTAN  (COZAAR ) 100 MG TABLET    Take 1 tablet (100 mg total) by mouth daily.   PIOGLITAZONE  (ACTOS ) 45 MG TABLET    Take 1 tablet (45 mg total) by mouth daily.   RELION ULTRA THIN LANCETS MISC    Use to test blood sugar up to three times daily. Dx:E11.42  Modified Medications   No medications on file  Discontinued Medications   No medications on file    Physical Exam: Vitals:   02/26/24 1501  BP: (!) 180/86  Pulse: 84  Resp: (!) 84  Temp: 98.5 F (36.9 C)  SpO2: 99%  Weight: 165 lb 6.4 oz (75 kg)  Height: 5' 2 (1.575 m)   Body mass index is 30.25 kg/m. BP Readings from Last 3 Encounters:  02/26/24 (!) 180/86  11/11/23 125/76  10/16/23 (!) 140/74   Wt Readings from Last 3 Encounters:  02/26/24 165 lb 6.4 oz (75 kg)  11/11/23 160 lb 9.6 oz (72.8 kg)  10/16/23 158 lb 12.8 oz (72 kg)    Physical Exam Constitutional:      Appearance: Normal appearance.  HENT:     Head: Normocephalic and atraumatic.  Cardiovascular:     Rate and Rhythm: Normal rate and regular rhythm.  Pulmonary:     Effort: Pulmonary effort is normal. No respiratory distress.     Breath sounds: Normal breath sounds. No wheezing.  Abdominal:     General: Bowel sounds are normal. There is no distension.     Tenderness: There is no abdominal tenderness. There is no guarding or rebound.     Comments:    Musculoskeletal:        General: Swelling present.     Comments: Chronic lymphoedema  1+ pitting edema Pigmentation changes  Neurological:     Mental Status: She is alert. Mental status is at baseline.     Motor: No weakness.     Labs reviewed: Basic  Metabolic Panel: Recent Labs    07/31/23 1101 08/13/23 1353 09/29/23 1700  NA 133* 144 138  K 3.9 4.3 3.9  CL 98 108 105  CO2 26 25 25   GLUCOSE 391* 192* 284*  BUN 16 16 14   CREATININE 0.76 0.71 0.77  CALCIUM 9.6 10.7* 10.1   Liver Function Tests: Recent Labs    07/17/23 1456 07/31/23 1101 09/29/23 1700  AST 17 22 29   ALT 19 16 29   ALKPHOS  --   --  81  BILITOT 0.6 0.8 0.5  PROT 6.3 6.4 7.1  ALBUMIN  --   --  3.8  No results for input(s): LIPASE, AMYLASE in the last 8760 hours. No results for input(s): AMMONIA in the last 8760 hours. CBC: Recent Labs    07/17/23 1456 07/31/23 1101 09/29/23 1700  WBC 5.5 6.3 4.7  NEUTROABS  --  4,964 3.0  HGB 14.3 13.2 14.1  HCT 44.8 42.0 44.7  MCV 87.8 88.1 89.0  PLT 184 133* 148*   Lipid Panel: Recent Labs    07/17/23 1456  CHOL 148  HDL 66  LDLCALC 66  TRIG 80  CHOLHDL 2.2   TSH: No results for input(s): TSH in the last 8760 hours. A1C: Lab Results  Component Value Date   HGBA1C 8.8 (H) 10/16/2023    Assessment and Plan Assessment & Plan   1. Swelling of lower extremity (Primary) Lungs clear to auscultation  Will start lasix  40 mg daily x 4 days  Limit salt intake Elevate feet  Use compression stockings  Follow up in 4 weeks    2. DM type 2 with diabetic peripheral neuropathy (HCC) Will stop actos  due to fluid build up  Will increase humulin  to 20 units bid  3. Primary hypertension Bp high  Repeat bp  Limit salt intake Will start lasix  40 mg daily  Follow up in 2 weeks  Cont with losartan  Will check bmp, bnp

## 2024-02-27 ENCOUNTER — Ambulatory Visit: Payer: Self-pay | Admitting: Sports Medicine

## 2024-02-27 LAB — BASIC METABOLIC PANEL WITH GFR
BUN: 18 mg/dL (ref 7–25)
CO2: 27 mmol/L (ref 20–32)
Calcium: 10 mg/dL (ref 8.6–10.4)
Chloride: 105 mmol/L (ref 98–110)
Creat: 0.74 mg/dL (ref 0.60–0.95)
Glucose, Bld: 278 mg/dL — ABNORMAL HIGH (ref 65–99)
Potassium: 4.6 mmol/L (ref 3.5–5.3)
Sodium: 140 mmol/L (ref 135–146)
eGFR: 77 mL/min/1.73m2 (ref >=60)

## 2024-02-27 LAB — BRAIN NATRIURETIC PEPTIDE: Brain Natriuretic Peptide: 63 pg/mL (ref <100)

## 2024-03-11 ENCOUNTER — Encounter: Payer: Self-pay | Admitting: Sports Medicine

## 2024-03-11 ENCOUNTER — Ambulatory Visit (INDEPENDENT_AMBULATORY_CARE_PROVIDER_SITE_OTHER): Admitting: Sports Medicine

## 2024-03-11 VITALS — BP 112/84 | HR 77 | Temp 98.1°F | Ht 62.0 in | Wt 166.8 lb

## 2024-03-11 DIAGNOSIS — M7989 Other specified soft tissue disorders: Secondary | ICD-10-CM | POA: Diagnosis not present

## 2024-03-11 DIAGNOSIS — Z23 Encounter for immunization: Secondary | ICD-10-CM | POA: Diagnosis not present

## 2024-03-11 DIAGNOSIS — I10 Essential (primary) hypertension: Secondary | ICD-10-CM

## 2024-03-11 MED ORDER — COVID-19 MRNA VACCINE (PFIZER) 30 MCG/0.3ML IM SUSP: 0.3000 mL | Freq: Once | INTRAMUSCULAR | 0 refills | Status: AC

## 2024-03-11 MED ORDER — FUROSEMIDE 20 MG PO TABS: 20.0000 mg | ORAL_TABLET | ORAL | 0 refills | Status: DC | PRN

## 2024-03-11 NOTE — Progress Notes (Unsigned)
 Careteam: Patient Care Team: Sherlynn Madden, MD as PCP - General (Internal Medicine) Octavia Bruckner, MD as Consulting Physician (Ophthalmology)  PLACE OF SERVICE:  [facility]  Advanced Directive information    Allergies  Allergen Reactions   Penicillins Other (See Comments)    Has patient had a PCN reaction causing immediate rash, facial/tongue/throat swelling, SOB or lightheadedness with hypotension:  Has patient had a PCN reaction causing severe rash involving mucus membranes or skin necrosis  Has patient had a PCN reaction that required hospitalization   Has patient had a PCN reaction occurring within the last 10 years:    *PT UNABLE TO RECALL REACTION TYPE.   If all of the above answers are NO, then may proceed with Cephalospori   Fluvastatin Other (See Comments)    Chief Complaint  Patient presents with   Medical Management of Chronic Issues    2 week follow up regarding of the swelling of her legs // pt stated its a lot better.      Discussed the use of AI scribe software for clinical note transcription with the patient, who gave verbal consent to proceed.  History of Present Illness  Olivia Roberts is an 89 year old female with hypertension and edema who presents for follow-up of her blood pressure and swelling. She is accompanied by her daughter, who is her primary caregiver.  Her blood pressure was previously recorded in the 180s and is now down to 112. She has been trying to reduce sodium intake by avoiding potato chips and other salty foods. Her blood pressure readings at home have been mostly in the 100s, with occasional spikes to 140 in the morning and higher readings in the afternoon after work.  Regarding her edema, she was previously on Lasix , taking two 20 mg tablets daily for five days, which helped reduce the swelling in her legs. Her daughter reports that her legs are now soft to the touch and not sore.     Her daughter manages her  medications, including insulin  administration, and ensures she follows dietary restrictions. She lives with her daughter, who is her primary caregiver, and her brother assists with insulin  administration when her daughter is at work.  No dizziness, lightheadedness, stomach pain, burning during urination, or breathing difficulties.    Review of Systems:  Review of Systems  Constitutional:  Negative for chills and fever.  HENT:  Negative for congestion and sore throat.   Respiratory:  Negative for cough, sputum production and shortness of breath.   Cardiovascular:  Negative for chest pain, palpitations and leg swelling.  Gastrointestinal:  Negative for abdominal pain, heartburn and nausea.  Genitourinary:  Negative for dysuria, frequency and hematuria.  Musculoskeletal:  Negative for falls and myalgias.  Neurological:  Negative for dizziness.   Negative unless indicated in HPI.   Past Medical History:  Diagnosis Date   Allergic rhinitis, cause unspecified    Chronic pancreatitis (HCC)    Dyspepsia and other specified disorders of function of stomach    GERD (gastroesophageal reflux disease)    Other constipation    Type II or unspecified type diabetes mellitus with ophthalmic manifestations, not stated as uncontrolled(250.50)    Type II or unspecified type diabetes mellitus without mention of complication, not stated as uncontrolled    Unspecified essential hypertension    Unspecified hemorrhoids without mention of complication    Past Surgical History:  Procedure Laterality Date   ABDOMINAL HYSTERECTOMY     CESAREAN SECTION  X two   Social History:   reports that she has never smoked. She has never used smokeless tobacco. She reports that she does not drink alcohol and does not use drugs.  Family History  Problem Relation Age of Onset   Hypertension Father    Colon cancer Sister        and Several Aunts   Diabetes Son     Medications: Patient's Medications  New  Prescriptions   No medications on file  Previous Medications   ASPIRIN 81 MG TABLET    Take 81 mg by mouth daily.   DOCUSATE SODIUM (COLACE) 100 MG CAPSULE    Take 100 mg by mouth as needed.   FUROSEMIDE  (LASIX ) 40 MG TABLET    Take 1 tablet (40 mg total) by mouth daily.   HUMULIN  70/30 KWIKPEN (70-30) 100 UNIT/ML KWIKPEN    Inject 20 Units into the skin in the morning and at bedtime.   INSULIN  PEN NEEDLE 31G X 5 MM MISC    1 Device by Does not apply route 2 (two) times daily. E08.40   LATANOPROST (XALATAN) 0.005 % OPHTHALMIC SOLUTION    Place 1 drop into both eyes at bedtime.   LOSARTAN  (COZAAR ) 100 MG TABLET    Take 1 tablet (100 mg total) by mouth daily.   RELION ULTRA THIN LANCETS MISC    Use to test blood sugar up to three times daily. Dx:E11.42  Modified Medications   No medications on file  Discontinued Medications   No medications on file    Physical Exam: Vitals:   03/11/24 1505  BP: 112/84  Pulse: 77  Temp: 98.1 F (36.7 C)  SpO2: 98%  Weight: 166 lb 12.8 oz (75.7 kg)  Height: 5' 2 (1.575 m)   Body mass index is 30.51 kg/m. BP Readings from Last 3 Encounters:  03/11/24 112/84  02/26/24 (!) 170/80  11/11/23 125/76   Wt Readings from Last 3 Encounters:  03/11/24 166 lb 12.8 oz (75.7 kg)  02/26/24 165 lb 6.4 oz (75 kg)  11/11/23 160 lb 9.6 oz (72.8 kg)    Physical Exam Constitutional:      Appearance: Normal appearance.  HENT:     Head: Normocephalic and atraumatic.  Cardiovascular:     Rate and Rhythm: Normal rate and regular rhythm.  Pulmonary:     Effort: Pulmonary effort is normal. No respiratory distress.     Breath sounds: Normal breath sounds. No wheezing.  Abdominal:     General: Bowel sounds are normal. There is no distension.     Tenderness: There is no abdominal tenderness. There is no guarding or rebound.     Comments:    Musculoskeletal:        General: Swelling present.     Comments: Chronic Pigmentation noted 1+ pitting oedema But  improved from last visit   Neurological:     Mental Status: She is alert. Mental status is at baseline.     Motor: No weakness.     Labs reviewed: Basic Metabolic Panel: Recent Labs    08/13/23 1353 09/29/23 1700 02/26/24 1536  NA 144 138 140  K 4.3 3.9 4.6  CL 108 105 105  CO2 25 25 27   GLUCOSE 192* 284* 278*  BUN 16 14 18   CREATININE 0.71 0.77 0.74  CALCIUM 10.7* 10.1 10.0   Liver Function Tests: Recent Labs    07/17/23 1456 07/31/23 1101 09/29/23 1700  AST 17 22 29   ALT 19 16 29  ALKPHOS  --   --  81  BILITOT 0.6 0.8 0.5  PROT 6.3 6.4 7.1  ALBUMIN  --   --  3.8   No results for input(s): LIPASE, AMYLASE in the last 8760 hours. No results for input(s): AMMONIA in the last 8760 hours. CBC: Recent Labs    07/17/23 1456 07/31/23 1101 09/29/23 1700  WBC 5.5 6.3 4.7  NEUTROABS  --  4,964 3.0  HGB 14.3 13.2 14.1  HCT 44.8 42.0 44.7  MCV 87.8 88.1 89.0  PLT 184 133* 148*   Lipid Panel: Recent Labs    07/17/23 1456  CHOL 148  HDL 66  LDLCALC 66  TRIG 80  CHOLHDL 2.2   TSH: No results for input(s): TSH in the last 8760 hours. A1C: Lab Results  Component Value Date   HGBA1C 8.8 (H) 10/16/2023    Assessment and Plan Assessment & Plan  1. Primary hypertension (Primary)  Bp improved    Latest Ref Rng & Units 02/26/2024    3:36 PM 09/29/2023    5:00 PM 08/13/2023    1:53 PM  BMP  Glucose 65 - 99 mg/dL 721  715  807   BUN 7 - 25 mg/dL 18  14  16    Creatinine 0.60 - 0.95 mg/dL 9.25  9.22  9.28   BUN/Creat Ratio 6 - 22 (calc) SEE NOTE:   SEE NOTE:   Sodium 135 - 146 mmol/L 140  138  144   Potassium 3.5 - 5.3 mmol/L 4.6  3.9  4.3   Chloride 98 - 110 mmol/L 105  105  108   CO2 20 - 32 mmol/L 27  25  25    Calcium 8.6 - 10.4 mg/dL 89.9  89.8  89.2    Cont with losartan   Avoid salty foods Exercise regularly  2. Swelling of lower extremity  Improved Take lasix  prn  Use compression stockings Elevate feet  Limit salt intake - furosemide   (LASIX ) 20 MG tablet; Take 1 tablet (20 mg total) by mouth as needed.  Dispense: 30 tablet; Refill: 0  Other orders - COVID-19 mRNA vaccine, Pfizer, 30 MCG/0.3ML injection; Inject 0.3 mLs into the muscle once for 1 dose.  Dispense: 0.3 mL; Refill: 0

## 2024-04-23 ENCOUNTER — Other Ambulatory Visit: Payer: Self-pay | Admitting: Sports Medicine

## 2024-05-11 NOTE — Telephone Encounter (Signed)
 This encounter was created in error - please disregard.

## 2024-06-11 ENCOUNTER — Telehealth: Payer: Self-pay

## 2024-06-11 NOTE — Telephone Encounter (Signed)
 Patient was identified as falling into the [place] Measure - Diabetes.   Patient was: Attribution and/or data issue.  Validation/Investigation needed.  Explanation:  Patient is with [facility] and has appt 06/26/24.

## 2024-06-12 ENCOUNTER — Ambulatory Visit: Payer: Self-pay | Admitting: Family

## 2024-06-24 ENCOUNTER — Encounter (HOSPITAL_BASED_OUTPATIENT_CLINIC_OR_DEPARTMENT_OTHER): Payer: Self-pay

## 2024-06-24 ENCOUNTER — Ambulatory Visit (INDEPENDENT_AMBULATORY_CARE_PROVIDER_SITE_OTHER): Admitting: Family Medicine

## 2024-06-24 ENCOUNTER — Emergency Department (HOSPITAL_BASED_OUTPATIENT_CLINIC_OR_DEPARTMENT_OTHER)

## 2024-06-24 ENCOUNTER — Ambulatory Visit: Payer: Self-pay

## 2024-06-24 ENCOUNTER — Emergency Department (HOSPITAL_BASED_OUTPATIENT_CLINIC_OR_DEPARTMENT_OTHER)
Admission: EM | Admit: 2024-06-24 | Discharge: 2024-06-24 | Disposition: A | Discharge status: Home / Self Care | Attending: Emergency Medicine | Admitting: Emergency Medicine

## 2024-06-24 ENCOUNTER — Other Ambulatory Visit: Payer: Self-pay

## 2024-06-24 ENCOUNTER — Other Ambulatory Visit (HOSPITAL_BASED_OUTPATIENT_CLINIC_OR_DEPARTMENT_OTHER): Payer: Self-pay

## 2024-06-24 VITALS — BP 184/79 | HR 90 | Temp 98.2°F | Ht 62.0 in | Wt 168.4 lb

## 2024-06-24 DIAGNOSIS — N3000 Acute cystitis without hematuria: Secondary | ICD-10-CM | POA: Insufficient documentation

## 2024-06-24 DIAGNOSIS — I10 Essential (primary) hypertension: Secondary | ICD-10-CM | POA: Insufficient documentation

## 2024-06-24 DIAGNOSIS — I152 Hypertension secondary to endocrine disorders: Secondary | ICD-10-CM | POA: Diagnosis not present

## 2024-06-24 DIAGNOSIS — E119 Type 2 diabetes mellitus without complications: Secondary | ICD-10-CM | POA: Insufficient documentation

## 2024-06-24 DIAGNOSIS — F0280 Dementia in other diseases classified elsewhere without behavioral disturbance: Secondary | ICD-10-CM | POA: Diagnosis not present

## 2024-06-24 DIAGNOSIS — F05 Delirium due to known physiological condition: Secondary | ICD-10-CM | POA: Diagnosis not present

## 2024-06-24 DIAGNOSIS — Z7982 Long term (current) use of aspirin: Secondary | ICD-10-CM | POA: Insufficient documentation

## 2024-06-24 DIAGNOSIS — E1159 Type 2 diabetes mellitus with other circulatory complications: Secondary | ICD-10-CM

## 2024-06-24 DIAGNOSIS — R4182 Altered mental status, unspecified: Secondary | ICD-10-CM | POA: Diagnosis present

## 2024-06-24 DIAGNOSIS — G301 Alzheimer's disease with late onset: Secondary | ICD-10-CM | POA: Diagnosis not present

## 2024-06-24 DIAGNOSIS — R404 Transient alteration of awareness: Secondary | ICD-10-CM | POA: Insufficient documentation

## 2024-06-24 DIAGNOSIS — E1142 Type 2 diabetes mellitus with diabetic polyneuropathy: Secondary | ICD-10-CM

## 2024-06-24 DIAGNOSIS — R410 Disorientation, unspecified: Secondary | ICD-10-CM

## 2024-06-24 DIAGNOSIS — E1165 Type 2 diabetes mellitus with hyperglycemia: Secondary | ICD-10-CM | POA: Diagnosis not present

## 2024-06-24 LAB — POCT URINALYSIS DIPSTICK
Bilirubin, UA: NEGATIVE
Blood, UA: POSITIVE
Glucose, UA: POSITIVE — AB
Ketones, UA: 15
Nitrite, UA: NEGATIVE
Protein, UA: POSITIVE — AB
Spec Grav, UA: 1.015
Urobilinogen, UA: 0.2 U/dL
pH, UA: 6

## 2024-06-24 LAB — POCT GLYCOSYLATED HEMOGLOBIN (HGB A1C): Hemoglobin A1C: 10.4 % — AB (ref 4.0–5.6)

## 2024-06-24 LAB — CBC WITH DIFFERENTIAL/PLATELET
Abs Immature Granulocytes: 0.02 K/uL (ref 0.00–0.07)
Basophils Absolute: 0 K/uL (ref 0.0–0.1)
Basophils Relative: 0 %
Eosinophils Absolute: 0 K/uL (ref 0.0–0.5)
Eosinophils Relative: 1 %
HCT: 45 % (ref 36.0–46.0)
Hemoglobin: 14.5 g/dL (ref 12.0–15.0)
Immature Granulocytes: 0 %
Lymphocytes Relative: 15 %
Lymphs Abs: 1.3 K/uL (ref 0.7–4.0)
MCH: 27.5 pg (ref 26.0–34.0)
MCHC: 32.2 g/dL (ref 30.0–36.0)
MCV: 85.2 fL (ref 80.0–100.0)
Monocytes Absolute: 0.5 K/uL (ref 0.1–1.0)
Monocytes Relative: 6 %
Neutro Abs: 6.8 K/uL (ref 1.7–7.7)
Neutrophils Relative %: 78 %
Platelets: 158 K/uL (ref 150–400)
RBC: 5.28 MIL/uL — ABNORMAL HIGH (ref 3.87–5.11)
RDW: 13.3 % (ref 11.5–15.5)
WBC: 8.6 K/uL (ref 4.0–10.5)
nRBC: 0 % (ref 0.0–0.2)

## 2024-06-24 LAB — URINALYSIS, W/ REFLEX TO CULTURE (INFECTION SUSPECTED)
Bilirubin Urine: NEGATIVE
Glucose, UA: >=500 mg/dL — AB
Ketones, ur: NEGATIVE mg/dL
Nitrite: NEGATIVE
Protein, ur: NEGATIVE mg/dL
Specific Gravity, Urine: 1.015 (ref 1.005–1.030)
pH: 6 (ref 5.0–8.0)

## 2024-06-24 LAB — COMPREHENSIVE METABOLIC PANEL WITH GFR
ALT: 24 U/L (ref 0–44)
AST: 47 U/L — ABNORMAL HIGH (ref 15–41)
Albumin: 4.4 g/dL (ref 3.5–5.0)
Alkaline Phosphatase: 107 U/L (ref 38–126)
Anion gap: 12 (ref 5–15)
BUN: 14 mg/dL (ref 8–23)
CO2: 27 mmol/L (ref 22–32)
Calcium: 10.2 mg/dL (ref 8.9–10.3)
Chloride: 101 mmol/L (ref 98–111)
Creatinine, Ser: 0.73 mg/dL (ref 0.44–1.00)
GFR, Estimated: >60 mL/min
Glucose, Bld: 303 mg/dL — ABNORMAL HIGH (ref 70–99)
Potassium: 3.6 mmol/L (ref 3.5–5.1)
Sodium: 141 mmol/L (ref 135–145)
Total Bilirubin: 0.7 mg/dL (ref 0.0–1.2)
Total Protein: 7.4 g/dL (ref 6.5–8.1)

## 2024-06-24 LAB — CBG MONITORING, ED: Glucose-Capillary: 366 mg/dL — ABNORMAL HIGH (ref 70–99)

## 2024-06-24 MED ORDER — CEPHALEXIN 500 MG PO CAPS
500.0000 mg | ORAL_CAPSULE | Freq: Two times a day (BID) | ORAL | 0 refills | Status: AC
  Filled 2024-06-24: qty 14, 7d supply, fill #0

## 2024-06-24 MED ORDER — LACTATED RINGERS IV BOLUS
500.0000 mL | Freq: Once | INTRAVENOUS | Status: AC
  Administered 2024-06-24: 500 mL via INTRAVENOUS

## 2024-06-24 NOTE — Discharge Instructions (Addendum)
 You can wash her legs with a gentle soap and water and then apply an Aquaphor to the area and put on a tall sock.  Keep a close eye on her legs if she gets worse follow-up with her doctor.  If over the weekend she starts having vomiting, worsening confusion or other concerns return to the emergency room.  Continue her insulin  at home.  Other than her blood sugar being elevated the rest of her tests were good.

## 2024-06-24 NOTE — ED Triage Notes (Signed)
 Daughter reports increased confusion for 2 days. Sent by UC for elevated blood sugar.  Denies NV, chest pain, SHOB   Oriented to person, place, situation   Hx of dementia Confusion at baseline, but daughter states it is increased

## 2024-06-24 NOTE — Telephone Encounter (Signed)
 FYI Only or Action Required?: FYI only for provider: appointment scheduled on 12/31 at [facility].  Patient was last seen in primary care on 03/11/2024 by Sherlynn Madden, MD.  Called Nurse Triage reporting No chief complaint on file..  Symptoms began yesterday.  Interventions attempted: Nothing.  Symptoms are: stable.  Triage Disposition: See HCP Within 4 Hours (Or PCP Triage)  Patient/caregiver understands and will follow disposition?: Yes  Triage Disposition: See HCP Within 4 Hours (Or PCP Triage)  Patient/caregiver understands and will follow disposition?:   Copied from CRM [id number]. Topic: Clinical - Red Word Triage >> Jun 24, 2024  8:38 AM Farrel B wrote: [facility] that prompted transfer to Nurse Triage: pt insulin  levels are constantly spiking and speaking delusional going back in time, daughter states she fears a UTI Reason for Disposition  [1] Longstanding confusion (e.g., dementia, stroke) AND [2] getting worse  Answer Assessment - Initial Assessment Questions 1. LEVEL OF CONSCIOUSNESS: How are they (the patient) acting right now? (e.g., alert-oriented, confused, lethargic, stuporous, comatose)     Alert, mild confusion  2. ONSET: When did the confusion start?  (e.g., minutes, hours, days)     yesterday 3. PATTERN: Does this come and go, or has it been constant since it started?  Is it present now?     constant 4. ALCOHOL or DRUGS: Have they been drinking alcohol or taking any drugs?       5. NARCOTIC MEDICINES: Have they been receiving any narcotic medications? (e.g., morphine, Vicodin)      6. CAUSE: What do you think is causing the confusion?      UTI 7. OTHER SYMPTOMS: Are there any other symptoms? (e.g., difficulty breathing, fever, headache, weakness)     Denies fever, HA, vomiting  Protocols used: Confusion - Delirium-A-AH

## 2024-06-24 NOTE — ED Notes (Signed)
 Patient transported to CT

## 2024-06-24 NOTE — ED Notes (Signed)
 NT reported blood sugar of 366

## 2024-06-24 NOTE — Progress Notes (Signed)
 " Assessment & Plan   Assessment/Plan:        Acute altered mental status with possible urinary tract infection Acute onset of confusion and delusional behavior over the past two days. Urinalysis shows 3+ glucose, 1+ ketone, 1+ blood, and 1+ leukocyte, suggesting possible UTI. Differential includes UTI, hyperglycemia, and other metabolic disturbances. High risk for complications due to age and comorbidities. - Referred to emergency department for further evaluation and management - Requested stat labs and possible CT of the head to rule out stroke - Caregiver prefers to drive patient in private vehicle over EMS transport  Type 2 diabetes mellitus with hyperglycemia and dehydration Poorly controlled diabetes with hemoglobin A1c of 10. Urinalysis shows high glucose levels, indicating significant hyperglycemia and contributing to dehydration. Risk of diabetic complications due to high blood sugar levels. - Referred to emergency department for management of hyperglycemia and dehydration  Essential hypertension Severely elevated blood pressure at 184/79, posing high risk for cardiovascular events such as myocardial infarction or cerebrovascular accident. Hypertension may be contributing to altered mental status. - Referred to emergency department for management of hypertension  Dementia, Alzhimers Baseline cognitive impairment with recent exacerbation of confusion and delusional behavior. Possible contribution from UTI, hyperglycemia, or other metabolic disturbances. - Referred to emergency department for comprehensive evaluation           There are no discontinued medications.  Return if symptoms worsen or fail to improve.        Subjective:   Encounter date: 06/24/2024  Olivia Roberts is a 89 y.o. female who has Diabetes due to underlying condition w diabetic neurop, unsp (HCC); DIABETIC  RETINOPATHY; Hypertension associated with diabetes (HCC); HEMORRHOIDS; ALLERGIC  RHINITIS; DYSPEPSIA; CONSTIPATION; CONSTIPATION, CHRONIC; PANCREATITIS, CHRONIC; GERD (gastroesophageal reflux disease); IBS (irritable bowel syndrome); Type 2 diabetes mellitus with obesity; Cognitive decline; Hyperreflexia of lower extremity; Late onset Alzheimer's dementia without behavioral disturbance (HCC); Type 2 diabetes mellitus with diabetic polyneuropathy, without long-term current use of insulin  (HCC); Mild nonproliferative diabetic retinopathy of both eyes without macular edema associated with type 2 diabetes mellitus (HCC); Hypercholesterolemia; Anxiety; Type 2 diabetes mellitus with background retinopathy (HCC); and Transient alteration of awareness on their problem list..   She  has a past medical history of Allergic rhinitis, cause unspecified, Chronic pancreatitis (HCC), Dyspepsia and other specified disorders of function of stomach, GERD (gastroesophageal reflux disease), Other constipation, Type II or unspecified type diabetes mellitus with ophthalmic manifestations, not stated as uncontrolled(250.50), Type II or unspecified type diabetes mellitus without mention of complication, not stated as uncontrolled, Unspecified essential hypertension, and Unspecified hemorrhoids without mention of complication..   She presents with chief complaint of Acute Visit (Pt presents today for a possible uti. Pt daughter/caregiver angela is here with here and states she has been having confusion for 2 days. Mentioning past things and asking if someone is knocking. Pt eye exam is sch for Jan & ) .   Discussed the use of AI scribe software for clinical note transcription with the patient, who gave verbal consent to proceed.  History of Present Illness   Olivia Roberts is an 89 year old female with poorly controlled diabetes who presents with confusion and possible UTI. She is accompanied by her daughter, Jon, who is her primary caregiver.  Altered mental status - Two days of confusion  characterized by delusional speech and talking about the past - Auditory hallucinations and unusual singing behavior - History of mild dementia, but these symptoms are new  Hyperglycemia and  diabetes management - Poorly controlled diabetes with hemoglobin A1c of 10 - Elevated blood sugar levels - Urinalysis showing three plus glucose and one plus ketone - Signs of dehydration, including white lips - Resistance to drinking water - Recent switch from zero sugar ginger ales to water  Urinary tract symptoms - History of urinary tract infections - Urinalysis revealing one plus blood and one plus leukocyte  Hypertension - Very high blood pressure recorded at 184/79        ROS  Past Surgical History:  Procedure Laterality Date   ABDOMINAL HYSTERECTOMY     CESAREAN SECTION     X two    Medications Ordered Prior to Encounter[1]  Family History  Problem Relation Age of Onset   Hypertension Father    Colon cancer Sister        and Several Aunts   Diabetes Son     Social History   Socioeconomic History   Marital status: Widowed    Spouse name: Not on file   Number of children: 3   Years of education: Not on file   Highest education level: Not on file  Occupational History   Occupation: retired  Tobacco Use   Smoking status: Never   Smokeless tobacco: Never  Vaping Use   Vaping status: Never Used  Substance and Sexual Activity   Alcohol use: Never   Drug use: Never   Sexual activity: Not on file  Other Topics Concern   Not on file  Social History Narrative   No caffeine   Social Drivers of Health   Tobacco Use: Low Risk (03/11/2024)   Patient History    Smoking Tobacco Use: Never    Smokeless Tobacco Use: Never    Passive Exposure: Not on file  Financial Resource Strain: Not on file  Food Insecurity: Not on file  Transportation Needs: Not on file  Physical Activity: Not on file  Stress: Not on file  Social Connections: Not on file  Intimate Partner  Violence: Not on file  Depression (PHQ2-9): Low Risk (11/11/2023)   Depression (PHQ2-9)    PHQ-2 Score: 0  Alcohol Screen: Not on file  Housing: Not on file  Utilities: Not on file  Health Literacy: Not on file                                                                                                  Objective:  Physical Exam: BP (!) 184/79   Pulse 90   Temp 98.2 F (36.8 C)   Ht 5' 2 (1.575 m)   Wt 168 lb 6.4 oz (76.4 kg)   SpO2 99%   BMI 30.80 kg/m    Physical Exam   VITALS: BP- 184/79 GENERAL: Alert, cooperative, no acute distress HEENT: Normocephalic, normal oropharynx, moist mucous membranes CHEST: Clear to auscultation bilaterally, No wheezes, rhonchi, or crackles CARDIOVASCULAR: Normal heart rate and rhythm, S1 and S2 normal without murmurs ABDOMEN: Soft, non-tender, non-distended, without organomegaly, Normal bowel sounds EXTREMITIES: No cyanosis or edema NEUROLOGICAL: Cranial nerves grossly intact, Moves all extremities without gross motor or sensory deficit  Physical Exam  No results found.  No results found for this or any previous visit (from the past 2160 hours).      Beverley KATHEE Hummer, MD  I,Emily Lagle,acting as a scribe for Beverley KATHEE Hummer, MD.,have documented all relevant documentation on the behalf of Beverley KATHEE Hummer, MD.  LILLETTE Beverley KATHEE Hummer, MD, have reviewed all documentation for this visit. The documentation on 06/24/2024 for the exam, diagnosis, procedures, and orders are all accurate and complete.     [1]  Current Outpatient Medications on File Prior to Visit  Medication Sig Dispense Refill   aspirin 81 MG tablet Take 81 mg by mouth daily.     docusate sodium (COLACE) 100 MG capsule Take 100 mg by mouth as needed.     furosemide  (LASIX ) 20 MG tablet Take 1 tablet (20 mg total) by mouth as needed. 30 tablet 0   furosemide  (LASIX ) 40 MG tablet Take 1 tablet (40 mg total) by mouth daily. 15 tablet 0   HUMULIN  70/30 KWIKPEN  (70-30) 100 UNIT/ML KwikPen Inject 20 Units into the skin in the morning and at bedtime. 15 mL 3   Insulin  Pen Needle 31G X 5 MM MISC 1 Device by Does not apply route 2 (two) times daily. E08.40 100 each 11   latanoprost (XALATAN) 0.005 % ophthalmic solution Place 1 drop into both eyes at bedtime.     losartan  (COZAAR ) 100 MG tablet Take 1 tablet by mouth once daily 90 tablet 0   ReliOn Ultra Thin Lancets MISC Use to test blood sugar up to three times daily. Dx:E11.42 300 each 1   No current facility-administered medications on file prior to visit.   "

## 2024-06-24 NOTE — ED Provider Notes (Addendum)
 " [place] [facility] Provider Note   CSN: [id number] Arrival date & time: 06/24/24  1140     Patient presents with: Altered Mental Status   Olivia Roberts is a 89 y.o. female.   Patient is an 89 year old female with a history of diabetes, hypertension, chronic pancreatitis who is presenting today with her daughter who is also her caregiver due to change in mental status in the last 2 days.  Her daughter reports that she is normally forgetful but now she is talking about things that are not there and hearing things that are not there which is very unusual for her.  She reports she has been eating and drinking normally she has not had fever or cough.  She has not complained of any headache or vision changes.  Her daughter reports she still walking around the way she normally does and has not had any falls that she is aware of but patient does live at home by herself but she checks on her 3 times a day.  She has had no recent medication changes.  Patient has been complaining of some itching in her legs and has been putting Vaseline on her legs.  She denies any dysuria frequency or urgency.  Patient went to her PCP office and was seen today did show some small leukocytes in her urine and based on her history they sent her here for further evaluation.  The history is provided by the patient and a caregiver.  Altered Mental Status      Prior to Admission medications  Medication Sig Start Date End Date Taking? Authorizing Provider  cephALEXin  (KEFLEX ) 500 MG capsule Take 1 capsule (500 mg total) by mouth 2 (two) times daily. 06/24/24  Yes Doretha Folks, MD  EMBECTA PEN NEEDLE ULTRAFINE 32G X 6 MM MISC  04/17/24  Yes [provider]  aspirin 81 MG tablet Take 81 mg by mouth daily.    [provider]  docusate sodium (COLACE) 100 MG capsule Take 100 mg by mouth as needed.    [provider]  furosemide  (LASIX ) 20 MG tablet  Take 1 tablet (20 mg total) by mouth as needed. 03/11/24   Sherlynn Madden, MD  furosemide  (LASIX ) 40 MG tablet Take 1 tablet (40 mg total) by mouth daily. 02/26/24   Sherlynn Madden, MD  HUMULIN  70/30 KWIKPEN (70-30) 100 UNIT/ML KwikPen Inject 20 Units into the skin in the morning and at bedtime. 02/26/24   Sherlynn Madden, MD  Insulin  Pen Needle 31G X 5 MM MISC 1 Device by Does not apply route 2 (two) times daily. E08.40 01/08/24   Sherlynn Madden, MD  latanoprost (XALATAN) 0.005 % ophthalmic solution Place 1 drop into both eyes at bedtime.    [provider]  losartan  (COZAAR ) 100 MG tablet Take 1 tablet by mouth once daily 04/24/24   Veludandi, Prashanthi, MD  ReliOn Ultra Thin Lancets MISC Use to test blood sugar up to three times daily. Dx:E11.42 01/07/24   Sherlynn Madden, MD    Allergies: Penicillins and Fluvastatin    Review of Systems  Updated Vital Signs BP (!) 140/88 (BP Location: Right Arm)   Pulse 84   Temp (!) 97.5 F (36.4 C) (Oral)   Resp 18   SpO2 97%   Physical Exam Vitals and nursing note reviewed.  Constitutional:      General: She is not in acute distress.    Appearance: She is well-developed.  HENT:  Head: Normocephalic and atraumatic.  Eyes:     Pupils: Pupils are equal, round, and reactive to light.  Cardiovascular:     Rate and Rhythm: Normal rate and regular rhythm.     Heart sounds: Normal heart sounds. No murmur heard.    No friction rub.  Pulmonary:     Effort: Pulmonary effort is normal.     Breath sounds: Normal breath sounds. No wheezing or rales.  Abdominal:     General: Bowel sounds are normal. There is no distension.     Palpations: Abdomen is soft.     Tenderness: There is no abdominal tenderness. There is no guarding or rebound.  Musculoskeletal:        General: No tenderness. Normal range of motion.     Cervical back: Normal range of motion and neck supple.     Comments: Nonpitting edema present in  bilateral lower extremities with excoriation in the lower tib-fib with small pustule on the right medial leg and some mild erythema  Skin:    General: Skin is warm and dry.     Findings: No rash.  Neurological:     Mental Status: She is alert.     Cranial Nerves: No cranial nerve deficit.     Gait: Gait normal.     Comments: Oriented to person place and time.  Able to move upper and lower extremities without difficulty.  Able to walk without ataxic gait.  No pronator drift noted  Psychiatric:        Mood and Affect: Mood normal.        Behavior: Behavior normal.     (all labs ordered are listed, but only abnormal results are displayed) Labs Reviewed  CBC WITH DIFFERENTIAL/PLATELET - Abnormal; Notable for the following components:      Result Value   RBC 5.28 (*)    All other components within normal limits  URINALYSIS, W/ REFLEX TO CULTURE (INFECTION SUSPECTED) - Abnormal; Notable for the following components:   Glucose, UA >=500 (*)    Hgb urine dipstick TRACE (*)    Leukocytes,Ua TRACE (*)    Bacteria, UA MANY (*)    All other components within normal limits  COMPREHENSIVE METABOLIC PANEL WITH GFR - Abnormal; Notable for the following components:   Glucose, Bld 303 (*)    AST 47 (*)    All other components within normal limits  CBG MONITORING, ED - Abnormal; Notable for the following components:   Glucose-Capillary 366 (*)    All other components within normal limits  URINE CULTURE    EKG: EKG Interpretation Date/Time:  Wednesday June 24 2024 12:24:09 EST Ventricular Rate:  76 PR Interval:  167 QRS Duration:  88 QT Interval:  469 QTC Calculation: 528 R Axis:   27  Text Interpretation: Sinus rhythm new Prolonged QT interval Confirmed by Doretha Folks ([id number]) on 06/24/2024 1:05:33 PM  Radiology: [facility] Chest Port 1 View Result Date: 06/24/2024 CLINICAL DATA:  Altered mental status EXAM: PORTABLE CHEST 1 VIEW COMPARISON:  July 08, 2016 FINDINGS: The heart  size and mediastinal contours are within normal limits. Both lungs are clear. The visualized skeletal structures are unremarkable. IMPRESSION: No active disease. Electronically Signed   By: Lynwood Landy Raddle M.D.   On: 06/24/2024 13:58   CT Head Wo Contrast Result Date: 06/24/2024 EXAM: CT HEAD WITHOUT 06/24/2024 01:26:46 PM TECHNIQUE: CT of the head was performed without the administration of intravenous contrast. Automated exposure control, iterative reconstruction, and/or weight based adjustment  of the mA/kV was utilized to reduce the radiation dose to as low as reasonably achievable. COMPARISON: None available. CLINICAL HISTORY: Mental status change, unknown cause Mental status change, unknown cause FINDINGS: BRAIN AND VENTRICLES: No acute intracranial hemorrhage. No mass effect or midline shift. No extra-axial fluid collection. No evidence of acute infarct. No hydrocephalus. ORBITS: No acute abnormality. SINUSES AND MASTOIDS: No acute abnormality. SOFT TISSUES AND SKULL: No acute skull fracture. No acute soft tissue abnormality. IMPRESSION: 1. No acute intracranial abnormality. Electronically signed by: Franky Crease MD 06/24/2024 01:45 PM EST RP Workstation: [id number]     Procedures   Medications Ordered in the ED  lactated ringers bolus 500 mL (500 mLs Intravenous New Bag/Given 06/24/24 1226)                                    Medical Decision Making Amount and/or Complexity of Data Reviewed External Data Reviewed: notes. Labs: ordered. Decision-making details documented in ED Course. Radiology: ordered and independent interpretation performed. Decision-making details documented in ED Course. ECG/medicine tests: ordered and independent interpretation performed. Decision-making details documented in ED Course.  Risk Prescription drug management.   Pt with multiple medical problems and comorbidities and presenting today with a complaint that caries a high risk for morbidity and  mortality.  Here today with the above complaints with altered mental status which sounds more like delirium.  Concern for infectious cause for this versus intracranial abnormality such as possible bleed if she had a fall that was unknown.  Patient has not had any change in medications.  Also daughter did report the blood sugar is running high so could be related to hyperglycemia and metabolic encephalopathy.  Will check labs.  Patient has no focal findings on exam to suggest stroke.  Blood sugar was in the 300s here despite her getting her home insulin  and will give fluid.  She does not appear to have findings consistent with CHF at this time.  Low suspicion for an acute cardiac cause.  On exam patient does have excoriation of both legs but a pustule present on the right medial lower tib-fib area which could be early cellulitis.  3:08 PM I only interpreted patient's labs and CBC is within normal limits, UA with 11-20 white cells and many bacteria but no red cells and looks similar to prior urine which was positive for E. coli that was sensitive to everything except Bactrim .  CMP is with hyperglycemia but no other acute findings.. I have independently visualized and interpreted pt's images today.  Head CT without evidence of intracranial hemorrhage and radiology reports no acute findings.  Chest x-ray within normal limits.  Patient treated with Keflex  which will also cover strep species for the area on her leg.  Discussed all this with her daughter and return precautions.     Final diagnoses:  Delirium  Acute cystitis without hematuria    ED Discharge Orders          Ordered    cephALEXin  (KEFLEX ) 500 MG capsule  2 times daily        06/24/24 1507               Doretha Folks, MD 06/24/24 1453    Doretha Folks, MD 06/24/24 1509  "

## 2024-06-24 NOTE — Telephone Encounter (Signed)
 Appt made with alternate provider.

## 2024-06-24 NOTE — Patient Instructions (Addendum)
 It was very nice to see you today!  VISIT SUMMARY: Today, you were seen for confusion and possible urinary tract infection (UTI). Your blood sugar levels were very high, and you showed signs of dehydration. Your blood pressure was also very high. Due to these concerns, you were referred to the emergency department for further evaluation and management.  YOUR PLAN: ACUTE ALTERED MENTAL STATUS WITH POSSIBLE URINARY TRACT INFECTION: You have been experiencing confusion and delusional behavior for the past two days. Your urinalysis suggests a possible UTI. -You have been referred to the emergency department for further evaluation and management.  TYPE 2 DIABETES MELLITUS WITH HYPERGLYCEMIA AND DEHYDRATION: Your diabetes is poorly controlled, and your blood sugar levels are very high, which is causing dehydration. -You have been referred to the emergency department for management of your high blood sugar levels and dehydration.  ESSENTIAL HYPERTENSION: Your blood pressure is very high, which increases your risk for heart attacks and strokes. -You have been referred to the emergency department for management of your high blood pressure.  DEMENTIA: You have a history of mild dementia, and your confusion has worsened recently. This may be due to a UTI, high blood sugar, or other issues. -You have been referred to the emergency department for a comprehensive evaluation.  Return if symptoms worsen or fail to improve.   Take care, Olivia Hummer, MD, MS   PLEASE NOTE:  If you had any lab tests, please let us  know if you have not heard back within a few days. You may see your results on mychart before we have a chance to review them but we will give you a call once they are reviewed by us .   If we ordered any referrals today, please let us  know if you have not heard from their office within the next week.   If you had any urgent prescriptions sent in today, please check with the pharmacy within an  hour of our visit to make sure the prescription was transmitted appropriately.   Please try these tips to maintain a healthy lifestyle:  Eat at least 3 REAL meals and 1-2 snacks per day.  Aim for no more than 5 hours between eating.  If you eat breakfast, please do so within one hour of getting up.   Each meal should contain half fruits/vegetables, one quarter protein, and one quarter carbs (no bigger than a computer mouse)  Cut down on sweet beverages. This includes juice, soda, and sweet tea.   Drink at least 1 glass of water with each meal and aim for at least 8 glasses per day  Exercise at least 150 minutes every week.

## 2024-06-26 ENCOUNTER — Ambulatory Visit: Admitting: Family

## 2024-06-26 LAB — URINE CULTURE: Culture: >=100000 — AB

## 2024-06-27 ENCOUNTER — Telehealth (HOSPITAL_BASED_OUTPATIENT_CLINIC_OR_DEPARTMENT_OTHER): Payer: Self-pay | Admitting: *Deleted

## 2024-06-27 NOTE — Telephone Encounter (Signed)
 Post ED Visit - Positive Culture Follow-up  Culture report reviewed by antimicrobial stewardship pharmacist: [facility] []  [facility], Pharm.D. []  Venetia Gully, Pharm.D., BCPS AQ-ID []  Garrel Crews, Pharm.D., BCPS []  Almarie Lunger, Pharm.D., BCPS []  [place], [place].D., BCPS, AAHIVP []  Rosaline Bihari, Pharm.D., BCPS, AAHIVP []  Vernell Meier, PharmD, BCPS []  Latanya Hint, PharmD, BCPS []  Donald Medley, PharmD, BCPS []  Rocky Bold, PharmD []  Dorothyann Alert, PharmD, BCPS [x]  Dorn Buttner, PharmD  [facility] []  Rosaline Edison, PharmD []  Romona Bliss, PharmD []  Dolphus Roller, PharmD []  Veva Seip, Rph []  Vernell Daunt) Leonce, PharmD []  Eva Allis, PharmD []  Rosaline Millet, PharmD []  Iantha Batch, PharmD []  Arvin Gauss, PharmD []  Wanda Hasting, PharmD []  Ronal Rav, PharmD []  Rocky Slade, PharmD []  Bard Jeans, PharmD   Positive urine culture Treated with Cephalexin , organism sensitive to the same and no further patient follow-up is required at this time.  Albino Alan Novak 06/27/2024, 4:02 PM

## 2024-07-03 ENCOUNTER — Encounter: Payer: Self-pay | Admitting: Family

## 2024-07-03 ENCOUNTER — Ambulatory Visit: Admitting: Family

## 2024-07-03 ENCOUNTER — Other Ambulatory Visit: Payer: Self-pay

## 2024-07-03 DIAGNOSIS — M7989 Other specified soft tissue disorders: Secondary | ICD-10-CM

## 2024-07-03 DIAGNOSIS — Z0000 Encounter for general adult medical examination without abnormal findings: Secondary | ICD-10-CM | POA: Diagnosis not present

## 2024-07-03 MED ORDER — FUROSEMIDE 20 MG PO TABS: 20.0000 mg | ORAL_TABLET | ORAL | 3 refills | Status: DC | PRN

## 2024-07-03 NOTE — Progress Notes (Signed)
 "  This service is provided via telemedicine  No vital signs collected/recorded due to the encounter was a telemedicine visit.   Location of patient (ex: home, work):  Home  Patient consents to a telephone visit: Yes  Location of the provider (ex: office, home):  [facility], Office   Name of any referring provider:  N/A  Names of all persons participating in the telemedicine service and their role in the encounter:  Shelli Ferrier, CMA, Patient, and Daughter Anglea and Jaydence Arnesen, Roxan BROCKS, NP   Time spent on call:  9 min with medical assistant     Chief Complaint  Patient presents with   [facility] Wellness    Annual Wellness Visit.     Subjective:   Olivia Roberts is a 89 y.o. female who presents for a [employer] Visit.  Visit info / Clinical Intake: Medicare Wellness Visit Type:: Initial Annual Wellness Visit Persons participating in visit and providing information:: patient & caregiver Medicare Wellness Visit Mode:: Video Since this visit was completed virtually, some vitals may be partially provided or unavailable. Missing vitals are due to the limitations of the virtual format.: Unable to obtain vitals - no equipment If Telephone or Video please confirm:: I connected with patient using audio/video enable telemedicine. I verified patient identity with two identifiers, discussed telehealth limitations, and patient agreed to proceed. Patient Location:: Home Provider Location:: [facility] Interpreter Needed?: No Pre-visit prep was completed: yes AWV questionnaire completed by patient prior to visit?: no Living arrangements:: with family/others Patient's Overall Health Status Rating: (!) fair Typical amount of pain: none Does pain affect daily life?: no Are you currently prescribed opioids?: no  Dietary Habits and Nutritional Risks How many meals a day?: 3 Eats fruit and vegetables daily?: yes Most meals are obtained  by: having others provide food In the last 2 weeks, have you had any of the following?: none Diabetic:: (!) yes Any non-healing wounds?: no How often do you check your BS?: 2 Would you like to be referred to a Nutritionist or for Diabetic Management? : no  Fall Screening Falls in the past year?: 0 Number of falls in past year: 0 Was there an injury with Fall?: 0 Fall Risk Category Calculator: 0 Patient Fall Risk Level: Low Fall Risk  Fall Risk Patient at Risk for Falls Due to: No Fall Risks Fall risk Follow up: Falls evaluation completed  Cognitive Assessment Difficulty concentrating, remembering, or making decisions? : yes (Remembering) Will 6CIT or Mini Cog be Completed: yes What year is it?: 4 points What month is it?: 0 points Give patient an address phrase to remember (5 components): [address] [facility] [place]  About what time is it?: 0 points Count backwards from 20 to 1: 0 points Say the months of the year in reverse: 4 points Repeat the address phrase from earlier: 10 points 6 CIT Score: 18 points  Advance Directives (For Healthcare) Does Patient Have a Medical Advance Directive?: No Would patient like information on creating a medical advance directive?: No - Patient declined    Allergies (verified) Penicillins and Fluvastatin   Current Medications (verified) Outpatient Encounter Medications as of 07/03/2024  Medication Sig   aspirin 81 MG tablet Take 81 mg by mouth daily.   cephALEXin  (KEFLEX ) 500 MG capsule Take 1 capsule (500 mg total) by mouth 2 (two) times daily.   EMBECTA PEN NEEDLE ULTRAFINE 32G X 6 MM MISC    furosemide  (LASIX ) 20 MG tablet  Take 1 tablet (20 mg total) by mouth as needed.   furosemide  (LASIX ) 40 MG tablet Take 1 tablet (40 mg total) by mouth daily.   HUMULIN  70/30 KWIKPEN (70-30) 100 UNIT/ML KwikPen Inject 20 Units into the skin in the morning and at bedtime.   Insulin  Pen Needle 31G X 5 MM MISC 1 Device by Does not apply route 2  (two) times daily. E08.40   latanoprost (XALATAN) 0.005 % ophthalmic solution Place 1 drop into both eyes at bedtime.   losartan  (COZAAR ) 100 MG tablet Take 1 tablet by mouth once daily   ReliOn Ultra Thin Lancets MISC Use to test blood sugar up to three times daily. Dx:E11.42   docusate sodium (COLACE) 100 MG capsule Take 100 mg by mouth as needed. (Patient not taking: Reported on 07/03/2024)   No facility-administered encounter medications on file as of 07/03/2024.    History: Past Medical History:  Diagnosis Date   Allergic rhinitis, cause unspecified    Chronic pancreatitis (HCC)    Dyspepsia and other specified disorders of function of stomach    GERD (gastroesophageal reflux disease)    Other constipation    Type II or unspecified type diabetes mellitus with ophthalmic manifestations, not stated as uncontrolled(250.50)    Type II or unspecified type diabetes mellitus without mention of complication, not stated as uncontrolled    Unspecified essential hypertension    Unspecified hemorrhoids without mention of complication    Past Surgical History:  Procedure Laterality Date   ABDOMINAL HYSTERECTOMY     CESAREAN SECTION     X two   Family History  Problem Relation Age of Onset   Hypertension Father    Colon cancer Sister        and Several Aunts   Diabetes Son    Social History   Occupational History   Occupation: retired  Tobacco Use   Smoking status: Never   Smokeless tobacco: Never  Vaping Use   Vaping status: Never Used  Substance and Sexual Activity   Alcohol use: Never   Drug use: Never   Sexual activity: Not on file   Tobacco Counseling Counseling given: Not Answered  SDOH Screenings   Food Insecurity: No Food Insecurity (07/03/2024)  Housing: Unknown (07/03/2024)  Transportation Needs: No Transportation Needs (07/03/2024)  Utilities: Not At Risk (07/03/2024)  Depression (PHQ2-9): Low Risk (07/03/2024)  Tobacco Use: Low Risk (07/03/2024)   See flowsheets for  full screening details  Depression Screen PHQ 2 & 9 Depression Scale- Over the past 2 weeks, how often have you been bothered by any of the following problems? Little interest or pleasure in doing things: 0 Feeling down, depressed, or hopeless (PHQ Adolescent also includes...irritable): 0 PHQ-2 Total Score: 0     Goals Addressed   None          Objective:    There were no vitals filed for this visit. There is no height or weight on file to calculate BMI.  Hearing/Vision screen Hearing Screening - Comments:: Some hearing issues. Vision Screening - Comments:: Eye exam last summer pt has cataracts &amp; pt has upcoming appointment 07-24-2024 (Dr.Chris Groat) Immunizations and Health Maintenance Health Maintenance  Topic Date Due   Medicare Annual Wellness (AWV)  03/21/2022   OPHTHALMOLOGY EXAM  05/27/2024   Zoster Vaccines- Shingrix (1 of 2) 10/01/2024 (Originally 02/12/1983)   DTaP/Tdap/Td (2 - Td or Tdap) 10/15/2024 (Originally 02/28/2022)   Pneumococcal Vaccine: 50+ Years (1 of 2 - PCV) 10/15/2024 (Originally 02/12/1952)  COVID-19 Vaccine (3 - 2025-26 season) 07/18/2027 (Originally 02/24/2024)   FOOT EXAM  07/16/2024   HEMOGLOBIN A1C  12/22/2024   Influenza Vaccine  Completed   Bone Density Scan  Completed   Meningococcal B Vaccine  Aged Out        Assessment/Plan:  This is a routine wellness examination for Trinisha.  Patient Care Team: Addisson Frate, Roxan BROCKS, NP as PCP - General (Family Medicine) Octavia Bruckner, MD as Consulting Physician (Ophthalmology)  I have personally reviewed and noted the following in the patients chart:   Medical and social history Use of alcohol, tobacco or illicit drugs  Current medications and supplements including opioid prescriptions. Functional ability and status Nutritional status Physical activity Advanced directives List of other physicians Hospitalizations, surgeries, and ER visits in previous 12 months Vitals Screenings to  include cognitive, depression, and falls Referrals and appointments  No orders of the defined types were placed in this encounter.  In addition, I have reviewed and discussed with patient certain preventive protocols, quality metrics, and best practice recommendations. A written personalized care plan for preventive services as well as general preventive health recommendations were provided to patient.   Roxan BROCKS Navin Dogan, NP   07/03/2024   No follow-ups on file.  After Visit Summary: (MyChart) Due to this being a telephonic visit, the after visit summary with patients personalized plan was offered to patient via MyChart   Nurse Notes: Declined immunization   Spent 23 minutes of non-face to face with patient  >50% time spent counseling; reviewing medical record; tests; labs; and developing future plan of care.  I connected with  Aracelis L Kreft on 07/03/2024 by a video enabled telemedicine application and verified that I am speaking with the correct person using two identifiers.   I discussed the limitations of evaluation and management by telemedicine. The patient expressed understanding and agreed to proceed.   "

## 2024-07-08 ENCOUNTER — Telehealth: Payer: Self-pay

## 2024-07-08 DIAGNOSIS — M7989 Other specified soft tissue disorders: Secondary | ICD-10-CM

## 2024-07-08 MED ORDER — FUROSEMIDE 20 MG PO TABS: 20.0000 mg | ORAL_TABLET | Freq: Every day | ORAL | 3 refills | Status: AC | PRN

## 2024-07-08 NOTE — Telephone Encounter (Signed)
 Furosemide  20 mg tablet take 1 by mouth daily as needed prescription resent to pharmacy.

## 2024-07-08 NOTE — Telephone Encounter (Signed)
 Informing fax received from pharmacy, rx for furosemide  20 was sent over with the instruction as needed and that is unacceptable for the insurance company.   Please rewrite script with SPECIFIC instructions and resubmit.

## 2024-07-10 LAB — OPHTHALMOLOGY REPORT-SCANNED

## 2024-07-20 ENCOUNTER — Other Ambulatory Visit: Payer: Self-pay

## 2024-07-20 DIAGNOSIS — E1142 Type 2 diabetes mellitus with diabetic polyneuropathy: Secondary | ICD-10-CM

## 2024-07-20 MED ORDER — HUMULIN 70/30 KWIKPEN (70-30) 100 UNIT/ML ~~LOC~~ SUPN: 20.0000 [IU] | PEN_INJECTOR | Freq: Two times a day (BID) | SUBCUTANEOUS | 11 refills | Status: AC

## 2024-07-20 MED ORDER — HUMULIN 70/30 KWIKPEN (70-30) 100 UNIT/ML ~~LOC~~ SUPN: 20.0000 [IU] | PEN_INJECTOR | Freq: Two times a day (BID) | SUBCUTANEOUS | 0 refills | Status: DC

## 2025-01-01 ENCOUNTER — Ambulatory Visit: Admitting: Family

## 2025-07-05 ENCOUNTER — Ambulatory Visit: Admitting: Family
